# Patient Record
Sex: Female | Born: 1950 | Race: White | Hispanic: No | Marital: Married | Smoking: Never smoker
Health system: Southern US, Community
[De-identification: ages and names within clinical notes are randomized; demographics above are authoritative.]

## PROBLEM LIST (undated history)

## (undated) DIAGNOSIS — M7120 Synovial cyst of popliteal space [Baker], unspecified knee: Secondary | ICD-10-CM

## (undated) DIAGNOSIS — C801 Malignant (primary) neoplasm, unspecified: Secondary | ICD-10-CM

## (undated) DIAGNOSIS — I10 Essential (primary) hypertension: Secondary | ICD-10-CM

## (undated) DIAGNOSIS — R809 Proteinuria, unspecified: Secondary | ICD-10-CM

## (undated) DIAGNOSIS — H35039 Hypertensive retinopathy, unspecified eye: Secondary | ICD-10-CM

## (undated) DIAGNOSIS — Z9641 Presence of insulin pump (external) (internal): Secondary | ICD-10-CM

## (undated) DIAGNOSIS — J45909 Unspecified asthma, uncomplicated: Secondary | ICD-10-CM

## (undated) DIAGNOSIS — K219 Gastro-esophageal reflux disease without esophagitis: Secondary | ICD-10-CM

## (undated) DIAGNOSIS — Z8489 Family history of other specified conditions: Secondary | ICD-10-CM

## (undated) DIAGNOSIS — E785 Hyperlipidemia, unspecified: Secondary | ICD-10-CM

## (undated) DIAGNOSIS — M1990 Unspecified osteoarthritis, unspecified site: Secondary | ICD-10-CM

## (undated) DIAGNOSIS — Z972 Presence of dental prosthetic device (complete) (partial): Secondary | ICD-10-CM

## (undated) DIAGNOSIS — K760 Fatty (change of) liver, not elsewhere classified: Secondary | ICD-10-CM

## (undated) DIAGNOSIS — B029 Zoster without complications: Secondary | ICD-10-CM

## (undated) DIAGNOSIS — E119 Type 2 diabetes mellitus without complications: Secondary | ICD-10-CM

## (undated) DIAGNOSIS — H409 Unspecified glaucoma: Secondary | ICD-10-CM

## (undated) DIAGNOSIS — E11618 Type 2 diabetes mellitus with other diabetic arthropathy: Secondary | ICD-10-CM

## (undated) HISTORY — PX: ESOPHAGOGASTRODUODENOSCOPY: SHX1529

## (undated) HISTORY — PX: JOINT REPLACEMENT: SHX530

## (undated) HISTORY — PX: COLONOSCOPY: SHX174

## (undated) HISTORY — PX: REFRACTIVE SURGERY: SHX103

## (undated) HISTORY — PX: TOTAL KNEE ARTHROPLASTY: SHX125

## (undated) HISTORY — PX: EYE SURGERY: SHX253

---

## 1980-08-25 HISTORY — PX: BREAST BIOPSY: SHX20

## 2003-08-26 HISTORY — PX: POPLITEAL SYNOVIAL CYST EXCISION: SUR555

## 2008-03-13 ENCOUNTER — Ambulatory Visit: Payer: Self-pay | Admitting: Nurse Practitioner

## 2008-04-22 ENCOUNTER — Emergency Department: Payer: Self-pay | Admitting: Emergency Medicine

## 2011-02-19 ENCOUNTER — Ambulatory Visit: Payer: Self-pay | Admitting: Family Medicine

## 2011-04-11 ENCOUNTER — Ambulatory Visit: Payer: Self-pay | Admitting: Gastroenterology

## 2011-04-30 ENCOUNTER — Ambulatory Visit: Payer: Self-pay | Admitting: Ophthalmology

## 2011-06-24 ENCOUNTER — Ambulatory Visit: Payer: Self-pay | Admitting: Ophthalmology

## 2011-06-24 DIAGNOSIS — I119 Hypertensive heart disease without heart failure: Secondary | ICD-10-CM

## 2011-07-07 ENCOUNTER — Ambulatory Visit: Payer: Self-pay | Admitting: Ophthalmology

## 2011-07-13 ENCOUNTER — Emergency Department: Payer: Self-pay | Admitting: Internal Medicine

## 2012-01-26 ENCOUNTER — Ambulatory Visit: Payer: Self-pay | Admitting: Anesthesiology

## 2012-01-26 LAB — BASIC METABOLIC PANEL
Anion Gap: 7 (ref 7–16)
Co2: 27 mmol/L (ref 21–32)
Creatinine: 0.77 mg/dL (ref 0.60–1.30)
Glucose: 77 mg/dL (ref 65–99)
Potassium: 4.3 mmol/L (ref 3.5–5.1)

## 2012-01-29 ENCOUNTER — Ambulatory Visit: Payer: Self-pay | Admitting: Otolaryngology

## 2012-03-31 ENCOUNTER — Ambulatory Visit: Payer: Self-pay | Admitting: Nurse Practitioner

## 2012-04-01 ENCOUNTER — Ambulatory Visit: Payer: Self-pay | Admitting: Nurse Practitioner

## 2012-05-19 DIAGNOSIS — M1990 Unspecified osteoarthritis, unspecified site: Secondary | ICD-10-CM | POA: Insufficient documentation

## 2012-08-25 DIAGNOSIS — B029 Zoster without complications: Secondary | ICD-10-CM

## 2012-08-25 HISTORY — DX: Zoster without complications: B02.9

## 2012-09-01 ENCOUNTER — Ambulatory Visit: Payer: Self-pay | Admitting: Nurse Practitioner

## 2012-11-05 ENCOUNTER — Ambulatory Visit: Payer: Self-pay | Admitting: Gastroenterology

## 2013-04-06 ENCOUNTER — Ambulatory Visit: Payer: Self-pay | Admitting: Nurse Practitioner

## 2013-06-07 DIAGNOSIS — E1159 Type 2 diabetes mellitus with other circulatory complications: Secondary | ICD-10-CM | POA: Insufficient documentation

## 2013-06-07 DIAGNOSIS — K219 Gastro-esophageal reflux disease without esophagitis: Secondary | ICD-10-CM | POA: Insufficient documentation

## 2013-11-22 ENCOUNTER — Ambulatory Visit: Payer: Self-pay | Admitting: Gastroenterology

## 2013-12-12 ENCOUNTER — Ambulatory Visit: Payer: Self-pay | Admitting: Gastroenterology

## 2013-12-12 ENCOUNTER — Emergency Department: Payer: Self-pay | Admitting: Emergency Medicine

## 2013-12-16 LAB — PATHOLOGY REPORT

## 2014-04-12 ENCOUNTER — Ambulatory Visit: Payer: Self-pay | Admitting: Nurse Practitioner

## 2014-04-27 DIAGNOSIS — Z4681 Encounter for fitting and adjustment of insulin pump: Secondary | ICD-10-CM | POA: Insufficient documentation

## 2014-04-27 DIAGNOSIS — E11649 Type 2 diabetes mellitus with hypoglycemia without coma: Secondary | ICD-10-CM | POA: Insufficient documentation

## 2014-04-27 DIAGNOSIS — R809 Proteinuria, unspecified: Secondary | ICD-10-CM | POA: Insufficient documentation

## 2014-12-17 NOTE — Op Note (Signed)
PATIENT NAME:  Cindy Sandoval, Cindy Sandoval MR#:  [id number] DATE OF BIRTH:  01-13-1951  DATE OF PROCEDURE:  01/29/2012  PREOPERATIVE DIAGNOSIS:  1. Chronic pansinusitis.  2. Nasal obstruction secondary to septal deformity and bilateral inferior turbinate hypertrophy.   POSTOPERATIVE DIAGNOSES:  1. Chronic pansinusitis.  2. Nasal obstruction secondary to septal deformity and bilateral inferior turbinate hypertrophy.   PROCEDURES:  1. Image guided sinus surgery.  2. Bilateral frontal sinusotomy with tissue removal.  3. Bilateral anterior and posterior ethmoidectomy with tissue removal.  4. Bilateral sphenoidectomy with tissue removal.  5. Bilateral maxillary sinus antrostomy with tissue removal.  6. Septoplasty.  7. Bilateral submucous resection of the inferior turbinates.   SURGEON: Janalee Dane, M.D.   FINDINGS: The paranasal sinuses were extremely inflamed. There was no purulence encountered. However, the mucosa was polypoid and inflamed throughout the ethmoid and frontal recesses. There were residual polyps lining the entirety of the left frontal antrum. The right frontal antrum was without polyps or purulence. The septum was deviated primarily to the left, at the maxillary crest, and back to the right more superiorly. The inferior turbinates were severely hypertrophied   DESCRIPTION OF PROCEDURE: The image-guided sinus surgery system mask was attached and registration was carried out in the standard fashion using the appropriate fiduciary points. Calibration of the system was confirmed and extensive review of the CT scan in all three dimensions preoperatively and intraoperatively was carried out.  Each instrument was registered and confirmed for anatomic accuracy.  The patient was turned 90 degrees counter clockwise from anesthesia.  The nose was anesthetized with infraorbital nerve blocks and septal injection with 0.5% Lidocaine and 0.25% Bupivacaine mixed with 1:150,000 with Epinephrine and  phenylephrine Lidocaine soaked pledgets, two on each side were placed and the face was prepped and draped in the usual fashion.  The pledgets were removed.  A 15 blade was used to make a left-sided hemitransfixion incision and septal mucoperichondrial mucoperiosteal leaflets elevated.  There was a large inferior spur that was resected with Jansen-Middleton forceps.  The remaining septum was deviated back and forth in an accordion like fashion.  The bony cartilaginous junction was then divided and a moderate amount of vomer and perpendicular plate was taken down with Jansen-Middleton forceps, releasing the tension on the remaining septum.  The septum then swung back into the midline.  The septal leaflets were closed with quilting 4-0 chromic suture.  The left sided hemitransfixion incision was closed with 4-0 plain gut.  Attention was directed to the turbinates which had been previously injected on the left.  The head of the inferior turbinate on the left was incised with a 15 blade and the medial mucoperiosteum was elevated using a Psychologist, educational.  Once this had been elevated Knight scissors were used to resect the conchal bone and lateral mucoperiosteum.  The inferior margin of the remaining mucoperiosteum was then cauterized with suction cautery and Surgiflo was placed at the inferior to the inferior margin of the remaining inferior turbinate.  An identical procedure was performed on the right inferior turbinate with once again placement of Surgiflo along its inferior margin.   Attention was directed to the sinus surgery. The left side was addressed first. The middle turbinate was medialized with a freer. The uncinate process was then identified and completely resected revealing the natural maxillary sinus ostium. The natural maxillary sinus ostium was progressively enlarged to create a large maxillary antrostomy, removing diseased tissue. The maxillary antrum was then irrigated copiously with saline. Attention  was directed to the ethmoid sinuses where the ethmoid sinuses were taken down from anterior to posterior preserving the skull base and the lamina papyracea, removing diseased tissue. After the ethmoid sinuses had been completely cleaned out of polypoid chronically inflamed mucosa preserving the mucosa on the medial side of the middle turbinate along the skull base and along the lamina papyracea, attention was directed to the sphenoid recess. The sphenoid sinus ostium was identified and progressively enlarged with Kerrison Rongeur, staying inferior and medial in orientation, to create a large sphenoid sinus antrostomy, removing diseased tissue. The 30 degree scope was then used to explore the frontal recess. The frontal recess was progressively enlarged meticulously, removing diseased tissue, and the frontal sinus was then copiously irrigated with saline as was the sphenoid sinus. Attention was then directed to the right side where an identical series of procedures was accomplished. Upon completion of the sinus surgery, the Surgiflo was placed. Temporary Telfa pledgets were placed and tied over the columella to prevent dislodging.    The patient was allowed to emerge from anesthesia, extubated in the operating room and taken to the recovery room in stable condition.  There were no complications.  Estimated blood loss was less than 10 milliliters. ____________________________ Lenna Sciara. Nadeen Landau, MD jmc:slb D: 01/29/2012 18:21:27 ET T: 01/30/2012 10:00:45 ET JOB#: [id number]  cc: Janalee Dane, MD, <Dictator> Nicholos Johns MD ELECTRONICALLY SIGNED 02/18/2012 12:18

## 2015-04-18 ENCOUNTER — Other Ambulatory Visit: Payer: Self-pay | Admitting: Nurse Practitioner

## 2015-04-18 DIAGNOSIS — Z1231 Encounter for screening mammogram for malignant neoplasm of breast: Secondary | ICD-10-CM

## 2015-04-19 ENCOUNTER — Ambulatory Visit
Admission: RE | Admit: 2015-04-19 | Discharge: 2015-04-19 | Disposition: A | Discharge status: Home / Self Care | Payer: PPO | Source: Ambulatory Visit | Attending: Nurse Practitioner | Admitting: Nurse Practitioner

## 2015-04-19 DIAGNOSIS — Z1231 Encounter for screening mammogram for malignant neoplasm of breast: Secondary | ICD-10-CM | POA: Insufficient documentation

## 2015-04-19 HISTORY — DX: Malignant (primary) neoplasm, unspecified: C80.1

## 2015-09-27 DIAGNOSIS — H02403 Unspecified ptosis of bilateral eyelids: Secondary | ICD-10-CM | POA: Diagnosis not present

## 2015-10-16 DIAGNOSIS — H40052 Ocular hypertension, left eye: Secondary | ICD-10-CM | POA: Diagnosis not present

## 2015-11-01 DIAGNOSIS — R809 Proteinuria, unspecified: Secondary | ICD-10-CM | POA: Diagnosis not present

## 2015-11-01 DIAGNOSIS — E1029 Type 1 diabetes mellitus with other diabetic kidney complication: Secondary | ICD-10-CM | POA: Diagnosis not present

## 2015-11-08 DIAGNOSIS — I10 Essential (primary) hypertension: Secondary | ICD-10-CM | POA: Diagnosis not present

## 2015-11-08 DIAGNOSIS — R809 Proteinuria, unspecified: Secondary | ICD-10-CM | POA: Diagnosis not present

## 2015-11-08 DIAGNOSIS — E785 Hyperlipidemia, unspecified: Secondary | ICD-10-CM | POA: Diagnosis not present

## 2015-11-08 DIAGNOSIS — E10649 Type 1 diabetes mellitus with hypoglycemia without coma: Secondary | ICD-10-CM | POA: Diagnosis not present

## 2015-11-08 DIAGNOSIS — E1042 Type 1 diabetes mellitus with diabetic polyneuropathy: Secondary | ICD-10-CM | POA: Diagnosis not present

## 2015-11-08 DIAGNOSIS — Z4681 Encounter for fitting and adjustment of insulin pump: Secondary | ICD-10-CM | POA: Diagnosis not present

## 2015-11-08 DIAGNOSIS — E1029 Type 1 diabetes mellitus with other diabetic kidney complication: Secondary | ICD-10-CM | POA: Diagnosis not present

## 2015-12-31 DIAGNOSIS — Z85828 Personal history of other malignant neoplasm of skin: Secondary | ICD-10-CM | POA: Diagnosis not present

## 2015-12-31 DIAGNOSIS — D485 Neoplasm of uncertain behavior of skin: Secondary | ICD-10-CM | POA: Diagnosis not present

## 2015-12-31 DIAGNOSIS — L570 Actinic keratosis: Secondary | ICD-10-CM | POA: Diagnosis not present

## 2015-12-31 DIAGNOSIS — Z08 Encounter for follow-up examination after completed treatment for malignant neoplasm: Secondary | ICD-10-CM | POA: Diagnosis not present

## 2015-12-31 DIAGNOSIS — Z1283 Encounter for screening for malignant neoplasm of skin: Secondary | ICD-10-CM | POA: Diagnosis not present

## 2015-12-31 DIAGNOSIS — L249 Irritant contact dermatitis, unspecified cause: Secondary | ICD-10-CM | POA: Diagnosis not present

## 2015-12-31 DIAGNOSIS — Z872 Personal history of diseases of the skin and subcutaneous tissue: Secondary | ICD-10-CM | POA: Diagnosis not present

## 2016-01-03 DIAGNOSIS — E1029 Type 1 diabetes mellitus with other diabetic kidney complication: Secondary | ICD-10-CM | POA: Diagnosis not present

## 2016-01-29 DIAGNOSIS — D225 Melanocytic nevi of trunk: Secondary | ICD-10-CM | POA: Diagnosis not present

## 2016-01-29 DIAGNOSIS — D485 Neoplasm of uncertain behavior of skin: Secondary | ICD-10-CM | POA: Diagnosis not present

## 2016-02-06 DIAGNOSIS — H9220 Otorrhagia, unspecified ear: Secondary | ICD-10-CM | POA: Diagnosis not present

## 2016-02-06 DIAGNOSIS — H6060 Unspecified chronic otitis externa, unspecified ear: Secondary | ICD-10-CM | POA: Diagnosis not present

## 2016-02-18 DIAGNOSIS — E1029 Type 1 diabetes mellitus with other diabetic kidney complication: Secondary | ICD-10-CM | POA: Diagnosis not present

## 2016-02-18 DIAGNOSIS — R809 Proteinuria, unspecified: Secondary | ICD-10-CM | POA: Diagnosis not present

## 2016-02-21 DIAGNOSIS — Z4681 Encounter for fitting and adjustment of insulin pump: Secondary | ICD-10-CM | POA: Diagnosis not present

## 2016-02-21 DIAGNOSIS — R809 Proteinuria, unspecified: Secondary | ICD-10-CM | POA: Diagnosis not present

## 2016-02-21 DIAGNOSIS — E10649 Type 1 diabetes mellitus with hypoglycemia without coma: Secondary | ICD-10-CM | POA: Diagnosis not present

## 2016-02-21 DIAGNOSIS — E1029 Type 1 diabetes mellitus with other diabetic kidney complication: Secondary | ICD-10-CM | POA: Diagnosis not present

## 2016-02-21 DIAGNOSIS — E785 Hyperlipidemia, unspecified: Secondary | ICD-10-CM | POA: Diagnosis not present

## 2016-02-21 DIAGNOSIS — E1042 Type 1 diabetes mellitus with diabetic polyneuropathy: Secondary | ICD-10-CM | POA: Diagnosis not present

## 2016-04-04 DIAGNOSIS — E1029 Type 1 diabetes mellitus with other diabetic kidney complication: Secondary | ICD-10-CM | POA: Diagnosis not present

## 2016-04-15 DIAGNOSIS — H40052 Ocular hypertension, left eye: Secondary | ICD-10-CM | POA: Diagnosis not present

## 2016-04-18 ENCOUNTER — Other Ambulatory Visit: Payer: Self-pay | Admitting: Nurse Practitioner

## 2016-04-18 DIAGNOSIS — Z0000 Encounter for general adult medical examination without abnormal findings: Secondary | ICD-10-CM | POA: Diagnosis not present

## 2016-04-18 DIAGNOSIS — E109 Type 1 diabetes mellitus without complications: Secondary | ICD-10-CM | POA: Diagnosis not present

## 2016-04-18 DIAGNOSIS — Z1231 Encounter for screening mammogram for malignant neoplasm of breast: Secondary | ICD-10-CM

## 2016-04-18 DIAGNOSIS — Z1159 Encounter for screening for other viral diseases: Secondary | ICD-10-CM | POA: Diagnosis not present

## 2016-04-18 DIAGNOSIS — E7800 Pure hypercholesterolemia, unspecified: Secondary | ICD-10-CM | POA: Diagnosis not present

## 2016-04-18 DIAGNOSIS — Z79899 Other long term (current) drug therapy: Secondary | ICD-10-CM | POA: Diagnosis not present

## 2016-04-18 DIAGNOSIS — I10 Essential (primary) hypertension: Secondary | ICD-10-CM | POA: Diagnosis not present

## 2016-04-25 DIAGNOSIS — H40052 Ocular hypertension, left eye: Secondary | ICD-10-CM | POA: Diagnosis not present

## 2016-05-06 ENCOUNTER — Ambulatory Visit: Admission: RE | Admit: 2016-05-06 | Payer: PPO | Source: Ambulatory Visit

## 2016-05-12 ENCOUNTER — Ambulatory Visit
Admission: RE | Admit: 2016-05-12 | Discharge: 2016-05-12 | Disposition: A | Discharge status: Home / Self Care | Payer: PPO | Source: Ambulatory Visit | Attending: Nurse Practitioner | Admitting: Nurse Practitioner

## 2016-05-12 DIAGNOSIS — Z1231 Encounter for screening mammogram for malignant neoplasm of breast: Secondary | ICD-10-CM

## 2016-05-15 DIAGNOSIS — E7800 Pure hypercholesterolemia, unspecified: Secondary | ICD-10-CM | POA: Diagnosis not present

## 2016-05-15 DIAGNOSIS — Z1159 Encounter for screening for other viral diseases: Secondary | ICD-10-CM | POA: Diagnosis not present

## 2016-05-15 DIAGNOSIS — E1029 Type 1 diabetes mellitus with other diabetic kidney complication: Secondary | ICD-10-CM | POA: Diagnosis not present

## 2016-05-15 DIAGNOSIS — R809 Proteinuria, unspecified: Secondary | ICD-10-CM | POA: Diagnosis not present

## 2016-05-15 DIAGNOSIS — Z79899 Other long term (current) drug therapy: Secondary | ICD-10-CM | POA: Diagnosis not present

## 2016-05-22 DIAGNOSIS — E1042 Type 1 diabetes mellitus with diabetic polyneuropathy: Secondary | ICD-10-CM | POA: Diagnosis not present

## 2016-05-22 DIAGNOSIS — R809 Proteinuria, unspecified: Secondary | ICD-10-CM | POA: Diagnosis not present

## 2016-05-22 DIAGNOSIS — Z23 Encounter for immunization: Secondary | ICD-10-CM | POA: Diagnosis not present

## 2016-05-22 DIAGNOSIS — E1029 Type 1 diabetes mellitus with other diabetic kidney complication: Secondary | ICD-10-CM | POA: Diagnosis not present

## 2016-05-22 DIAGNOSIS — Z4681 Encounter for fitting and adjustment of insulin pump: Secondary | ICD-10-CM | POA: Diagnosis not present

## 2016-05-22 DIAGNOSIS — R635 Abnormal weight gain: Secondary | ICD-10-CM | POA: Diagnosis not present

## 2016-05-22 DIAGNOSIS — E10649 Type 1 diabetes mellitus with hypoglycemia without coma: Secondary | ICD-10-CM | POA: Diagnosis not present

## 2016-05-22 DIAGNOSIS — E785 Hyperlipidemia, unspecified: Secondary | ICD-10-CM | POA: Diagnosis not present

## 2016-05-28 DIAGNOSIS — Z23 Encounter for immunization: Secondary | ICD-10-CM | POA: Diagnosis not present

## 2016-05-28 DIAGNOSIS — M25561 Pain in right knee: Secondary | ICD-10-CM | POA: Diagnosis not present

## 2016-05-28 DIAGNOSIS — M153 Secondary multiple arthritis: Secondary | ICD-10-CM | POA: Diagnosis not present

## 2016-05-28 DIAGNOSIS — G8929 Other chronic pain: Secondary | ICD-10-CM | POA: Diagnosis not present

## 2016-07-02 DIAGNOSIS — Z800 Family history of malignant neoplasm of digestive organs: Secondary | ICD-10-CM | POA: Diagnosis not present

## 2016-07-04 DIAGNOSIS — E1029 Type 1 diabetes mellitus with other diabetic kidney complication: Secondary | ICD-10-CM | POA: Diagnosis not present

## 2016-08-13 DIAGNOSIS — Z08 Encounter for follow-up examination after completed treatment for malignant neoplasm: Secondary | ICD-10-CM | POA: Diagnosis not present

## 2016-08-13 DIAGNOSIS — L298 Other pruritus: Secondary | ICD-10-CM | POA: Diagnosis not present

## 2016-08-13 DIAGNOSIS — L570 Actinic keratosis: Secondary | ICD-10-CM | POA: Diagnosis not present

## 2016-08-13 DIAGNOSIS — Z1283 Encounter for screening for malignant neoplasm of skin: Secondary | ICD-10-CM | POA: Diagnosis not present

## 2016-08-13 DIAGNOSIS — Z872 Personal history of diseases of the skin and subcutaneous tissue: Secondary | ICD-10-CM | POA: Diagnosis not present

## 2016-08-13 DIAGNOSIS — Z85828 Personal history of other malignant neoplasm of skin: Secondary | ICD-10-CM | POA: Diagnosis not present

## 2016-08-21 DIAGNOSIS — R635 Abnormal weight gain: Secondary | ICD-10-CM | POA: Diagnosis not present

## 2016-08-21 DIAGNOSIS — E1029 Type 1 diabetes mellitus with other diabetic kidney complication: Secondary | ICD-10-CM | POA: Diagnosis not present

## 2016-08-21 DIAGNOSIS — R809 Proteinuria, unspecified: Secondary | ICD-10-CM | POA: Diagnosis not present

## 2016-08-28 DIAGNOSIS — I10 Essential (primary) hypertension: Secondary | ICD-10-CM | POA: Diagnosis not present

## 2016-08-28 DIAGNOSIS — E10649 Type 1 diabetes mellitus with hypoglycemia without coma: Secondary | ICD-10-CM | POA: Diagnosis not present

## 2016-08-28 DIAGNOSIS — E1042 Type 1 diabetes mellitus with diabetic polyneuropathy: Secondary | ICD-10-CM | POA: Diagnosis not present

## 2016-08-28 DIAGNOSIS — R809 Proteinuria, unspecified: Secondary | ICD-10-CM | POA: Diagnosis not present

## 2016-08-28 DIAGNOSIS — E785 Hyperlipidemia, unspecified: Secondary | ICD-10-CM | POA: Diagnosis not present

## 2016-08-28 DIAGNOSIS — E1029 Type 1 diabetes mellitus with other diabetic kidney complication: Secondary | ICD-10-CM | POA: Diagnosis not present

## 2016-08-28 DIAGNOSIS — Z4681 Encounter for fitting and adjustment of insulin pump: Secondary | ICD-10-CM | POA: Diagnosis not present

## 2016-10-03 DIAGNOSIS — E1029 Type 1 diabetes mellitus with other diabetic kidney complication: Secondary | ICD-10-CM | POA: Diagnosis not present

## 2016-10-17 ENCOUNTER — Encounter: Payer: Self-pay | Admitting: *Deleted

## 2016-10-20 ENCOUNTER — Encounter
Admission: RE | Disposition: A | Discharge status: Home / Self Care | Payer: Self-pay | Source: Ambulatory Visit | Attending: Gastroenterology

## 2016-10-20 ENCOUNTER — Ambulatory Visit
Admission: RE | Admit: 2016-10-20 | Discharge: 2016-10-20 | Disposition: A | Discharge status: Home / Self Care | Payer: PPO | Source: Ambulatory Visit | Attending: Gastroenterology | Admitting: Gastroenterology

## 2016-10-20 ENCOUNTER — Ambulatory Visit: Payer: PPO | Admitting: Anesthesiology

## 2016-10-20 ENCOUNTER — Encounter: Payer: Self-pay | Admitting: Anesthesiology

## 2016-10-20 DIAGNOSIS — E119 Type 2 diabetes mellitus without complications: Secondary | ICD-10-CM | POA: Diagnosis not present

## 2016-10-20 DIAGNOSIS — Z85828 Personal history of other malignant neoplasm of skin: Secondary | ICD-10-CM | POA: Diagnosis not present

## 2016-10-20 DIAGNOSIS — Z800 Family history of malignant neoplasm of digestive organs: Secondary | ICD-10-CM | POA: Diagnosis not present

## 2016-10-20 DIAGNOSIS — Z8371 Family history of colonic polyps: Secondary | ICD-10-CM | POA: Diagnosis not present

## 2016-10-20 DIAGNOSIS — Z1211 Encounter for screening for malignant neoplasm of colon: Secondary | ICD-10-CM | POA: Diagnosis not present

## 2016-10-20 DIAGNOSIS — Z7982 Long term (current) use of aspirin: Secondary | ICD-10-CM | POA: Insufficient documentation

## 2016-10-20 DIAGNOSIS — K219 Gastro-esophageal reflux disease without esophagitis: Secondary | ICD-10-CM | POA: Diagnosis not present

## 2016-10-20 DIAGNOSIS — I10 Essential (primary) hypertension: Secondary | ICD-10-CM | POA: Diagnosis not present

## 2016-10-20 DIAGNOSIS — Z794 Long term (current) use of insulin: Secondary | ICD-10-CM | POA: Insufficient documentation

## 2016-10-20 DIAGNOSIS — M1990 Unspecified osteoarthritis, unspecified site: Secondary | ICD-10-CM | POA: Diagnosis not present

## 2016-10-20 DIAGNOSIS — Q438 Other specified congenital malformations of intestine: Secondary | ICD-10-CM | POA: Insufficient documentation

## 2016-10-20 DIAGNOSIS — Z79899 Other long term (current) drug therapy: Secondary | ICD-10-CM | POA: Insufficient documentation

## 2016-10-20 DIAGNOSIS — E785 Hyperlipidemia, unspecified: Secondary | ICD-10-CM | POA: Insufficient documentation

## 2016-10-20 HISTORY — DX: Type 2 diabetes mellitus without complications: E11.9

## 2016-10-20 HISTORY — DX: Essential (primary) hypertension: I10

## 2016-10-20 HISTORY — DX: Hyperlipidemia, unspecified: E78.5

## 2016-10-20 HISTORY — PX: COLONOSCOPY WITH PROPOFOL: SHX5780

## 2016-10-20 HISTORY — DX: Gastro-esophageal reflux disease without esophagitis: K21.9

## 2016-10-20 HISTORY — DX: Synovial cyst of popliteal space (Baker), unspecified knee: M71.20

## 2016-10-20 HISTORY — DX: Unspecified osteoarthritis, unspecified site: M19.90

## 2016-10-20 HISTORY — DX: Type 2 diabetes mellitus with other diabetic arthropathy: E11.618

## 2016-10-20 HISTORY — DX: Proteinuria, unspecified: R80.9

## 2016-10-20 LAB — GLUCOSE, CAPILLARY: GLUCOSE-CAPILLARY: 126 mg/dL — AB (ref 65–99)

## 2016-10-20 SURGERY — COLONOSCOPY WITH PROPOFOL
Anesthesia: General

## 2016-10-20 MED ORDER — PROPOFOL 500 MG/50ML IV EMUL
INTRAVENOUS | Status: DC | PRN
  Administered 2016-10-20: 140 ug/kg/min via INTRAVENOUS

## 2016-10-20 MED ORDER — MIDAZOLAM HCL 2 MG/2ML IJ SOLN
INTRAMUSCULAR | Status: DC | PRN
  Administered 2016-10-20: 2 mg via INTRAVENOUS

## 2016-10-20 MED ORDER — EPHEDRINE SULFATE 50 MG/ML IJ SOLN
INTRAMUSCULAR | Status: DC | PRN
  Administered 2016-10-20 (×2): 10 mg via INTRAVENOUS

## 2016-10-20 MED ORDER — SODIUM CHLORIDE 0.9 % IV SOLN
INTRAVENOUS | Status: DC
  Administered 2016-10-20: 1000 mL via INTRAVENOUS
  Administered 2016-10-20: 08:00:00 via INTRAVENOUS

## 2016-10-20 MED ORDER — SODIUM CHLORIDE 0.9 % IV SOLN: INTRAVENOUS | Status: DC

## 2016-10-20 MED ORDER — PROPOFOL 10 MG/ML IV BOLUS
INTRAVENOUS | Status: DC | PRN
  Administered 2016-10-20: 70 mg via INTRAVENOUS
  Administered 2016-10-20: 30 mg via INTRAVENOUS

## 2016-10-20 MED ORDER — FENTANYL CITRATE (PF) 100 MCG/2ML IJ SOLN
INTRAMUSCULAR | Status: AC
  Filled 2016-10-20: qty 2

## 2016-10-20 MED ORDER — PROPOFOL 10 MG/ML IV BOLUS
INTRAVENOUS | Status: AC
  Filled 2016-10-20: qty 20

## 2016-10-20 MED ORDER — LIDOCAINE HCL (PF) 2 % IJ SOLN
INTRAMUSCULAR | Status: AC
  Filled 2016-10-20: qty 2

## 2016-10-20 MED ORDER — PROPOFOL 500 MG/50ML IV EMUL
INTRAVENOUS | Status: AC
  Filled 2016-10-20: qty 50

## 2016-10-20 MED ORDER — FENTANYL CITRATE (PF) 100 MCG/2ML IJ SOLN
INTRAMUSCULAR | Status: DC | PRN
  Administered 2016-10-20: 50 ug via INTRAVENOUS
  Administered 2016-10-20: 25 ug via INTRAVENOUS

## 2016-10-20 MED ORDER — MIDAZOLAM HCL 2 MG/2ML IJ SOLN
INTRAMUSCULAR | Status: AC
  Filled 2016-10-20: qty 2

## 2016-10-20 NOTE — H&P (Signed)
Outpatient short stay form Pre-procedure 10/20/2016 7:22 AM Cindy Sails MD  Primary Physician: Gaetano Net NP  Reason for visit:  Colonoscopy  History of present illness:  Cindy Sandoval is a 66 year old female presenting today as above. There is family history of colon cancer in a primary relative as well as multiple primary relatives with colon polyps. She tolerated her prep well. She does take 81 mg aspirin but that has been held. She takes no other aspirin or blood thinning agents.    Current Facility-Administered Medications:  .  0.9 %  sodium chloride infusion, , Intravenous, Continuous, Manya Silvas, MD, Last Rate: 20 mL/hr at 10/20/16 0701, 1,000 mL at 10/20/16 0701 .  0.9 %  sodium chloride infusion, , Intravenous, Continuous, Cindy Sails, MD  Prescriptions Prior to Admission  Medication Sig Dispense Refill Last Dose  . amLODipine (NORVASC) 10 MG tablet Take 10 mg by mouth daily.   10/19/2016 at Unknown time  . aspirin EC 81 MG tablet Take 81 mg by mouth daily.   Past Week at Unknown time  . diclofenac sodium (VOLTAREN) 1 % GEL Apply topically 4 (four) times daily.   Past Week at Unknown time  . DULoxetine (CYMBALTA) 30 MG capsule Take 30 mg by mouth daily.   Past Week at Unknown time  . insulin aspart (NOVOLOG) 100 UNIT/ML injection Inject 100 Units into the skin 3 (three) times daily before meals.   Past Week at Unknown time  . insulin detemir (LEVEMIR) 100 UNIT/ML injection Inject 100 Units into the skin at bedtime.   Past Week at Unknown time  . Insulin Human (INSULIN PUMP) SOLN Inject into the skin.   Past Week at Unknown time  . losartan (COZAAR) 100 MG tablet Take 100 mg by mouth daily.   10/19/2016 at Unknown time  . omeprazole (PRILOSEC) 40 MG capsule Take 40 mg by mouth daily.   Past Week at Unknown time  . ondansetron (ZOFRAN-ODT) 4 MG disintegrating tablet Take 4 mg by mouth every 8 (eight) hours as needed for nausea or vomiting.   Past Week at Unknown time  .  pravastatin (PRAVACHOL) 20 MG tablet Take 20 mg by mouth daily.   10/19/2016 at Unknown time     Allergies  Allergen Reactions  . Kiwi Extract Anaphylaxis  . Bactroban [Mupirocin Calcium] Swelling  . Cephalexin Diarrhea  . Dexilant [Dexlansoprazole] Other (See Comments)  . Insulin Glargine Hives  . Lisinopril Other (See Comments)    UNKNOWN REACTION  . Septra [Sulfamethoxazole-Trimethoprim] Nausea And Vomiting  . Simvastatin Other (See Comments)  . Adhesive [Tape] Rash     Past Medical History:  Diagnosis Date  . Arthritis    OSTEOARTHRITIS  . Baker's cyst of knee   . Cancer ([place])    basal cell  . Diabetes mellitus without complication ([place])   . Diabetic arthropathy ([place])   . GERD (gastroesophageal reflux disease)   . Hyperlipidemia   . Hypertension   . Microalbuminuria     Review of systems:      Physical Exam    Heart and lungs: Regular rate and rhythm without rub or gallop, lungs are bilaterally clear.    HEENT: Normocephalic atraumatic eyes are anicteric    Other:     Pertinant exam for procedure: Soft nontender nondistended bowel sounds positive normoactive    Planned proceedures: Colonoscopy and indicated procedures. I have discussed the risks benefits and complications of procedures to include not limited to bleeding, infection, perforation and the  risk of sedation and the Cindy Sandoval wishes to proceed.    Cindy Sails, MD Gastroenterology 10/20/2016  7:22 AM

## 2016-10-20 NOTE — Anesthesia Post-op Follow-up Note (Cosign Needed)
Anesthesia QCDR form completed.        

## 2016-10-20 NOTE — Anesthesia Preprocedure Evaluation (Addendum)
Anesthesia Evaluation  Patient identified by MRN, date of birth, ID band Patient awake    Reviewed: Allergy & Precautions, H&P , NPO status , Patient's Chart, lab work & pertinent test results  History of Anesthesia Complications Negative for: history of anesthetic complications  Airway Mallampati: III  TM Distance: >3 FB Neck ROM: limited    Dental  (+) Poor Dentition, Chipped, Implants, Lower Dentures   Pulmonary neg pulmonary ROS, neg shortness of breath,    Pulmonary exam normal breath sounds clear to auscultation       Cardiovascular Exercise Tolerance: Good hypertension, (-) angina(-) Past MI and (-) DOE Normal cardiovascular exam Rhythm:regular Rate:Normal     Neuro/Psych negative neurological ROS  negative psych ROS   GI/Hepatic Neg liver ROS, GERD  Controlled,  Endo/Other  diabetes, Type 2, Insulin Dependent  Renal/GU negative Renal ROS  negative genitourinary   Musculoskeletal  (+) Arthritis ,   Abdominal   Peds  Hematology negative hematology ROS (+)   Anesthesia Other Findings Past Medical History: No date: Arthritis     Comment: OSTEOARTHRITIS No date: Baker's cyst of knee No date: Cancer ([place])     Comment: basal cell No date: Diabetes mellitus without complication (HCC) No date: Diabetic arthropathy (HCC) No date: GERD (gastroesophageal reflux disease) No date: Hyperlipidemia No date: Hypertension No date: Microalbuminuria  Past Surgical History: No date: BREAST BIOPSY Left     Comment: neg No date: COLONOSCOPY No date: ESOPHAGOGASTRODUODENOSCOPY  BMI    Body Mass Index:  31.83 kg/m      Reproductive/Obstetrics negative OB ROS                            Anesthesia Physical Anesthesia Plan  ASA: III  Anesthesia Plan: General   Post-op Pain Management:    Induction:   Airway Management Planned:   Additional Equipment:   Intra-op Plan:    Post-operative Plan:   Informed Consent: I have reviewed the patients History and Physical, chart, labs and discussed the procedure including the risks, benefits and alternatives for the proposed anesthesia with the patient or authorized representative who has indicated his/her understanding and acceptance.   Dental Advisory Given  Plan Discussed with: Anesthesiologist, CRNA and Surgeon  Anesthesia Plan Comments:         Anesthesia Quick Evaluation

## 2016-10-20 NOTE — Transfer of Care (Signed)
Immediate Anesthesia Transfer of Care Note  Patient: Cindy Sandoval  Procedure(s) Performed: Procedure(s): COLONOSCOPY WITH PROPOFOL (N/A)  Patient Location: PACU  Anesthesia Type:General  Level of Consciousness: awake  Airway & Oxygen Therapy: Patient Spontanous Breathing  Post-op Assessment: Report given to RN and Post -op Vital signs reviewed and stable  Post vital signs: Reviewed and stable  Last Vitals:  Vitals:   10/20/16 0647 10/20/16 0833  BP: 126/65 (!) 112/46  Pulse: 76 95  Resp: 16 12  Temp: (!) 36.1 C (!) 36.1 C    Last Pain:  Vitals:   10/20/16 0833  TempSrc: Tympanic         Complications: No apparent anesthesia complications

## 2016-10-20 NOTE — Op Note (Signed)
[facility] Gastroenterology Patient Name: Cindy Sandoval Procedure Date: 10/20/2016 7:46 AM MRN: [id number] Account #: [id number] Date of Birth: [birth year] Admit Type: Outpatient Age: 66 Room: [facility] ENDO ROOM 3 Gender: Female Note Status: Finalized Procedure:            Colonoscopy Indications:          Family history of colon cancer in a first-degree                        relative, Family history of colonic polyps in a                        first-degree relative Providers:            Lollie Sails, MD Medicines:            Monitored Anesthesia Care Complications:        No immediate complications. Procedure:            Pre-Anesthesia Assessment:                       - ASA Grade Assessment: III - A patient with severe                        systemic disease.                       After obtaining informed consent, the colonoscope was                        passed under direct vision. Throughout the procedure,                        the patient's blood pressure, pulse, and oxygen                        saturations were monitored continuously. The                        Colonoscope was introduced through the anus and                        advanced to the the cecum, identified by appendiceal                        orifice and ileocecal valve. The patient tolerated the                        procedure well. The quality of the bowel preparation                        was fair. Findings:      The sigmoid colon was moderately redundant.      The colon (entire examined portion) appeared normal.      The digital rectal exam was normal. Impression:           - Preparation of the colon was fair.                       - Redundant colon.                       -  The entire examined colon is normal.                       - No specimens collected. Recommendation:       - Discharge patient to home.                       - Repeat colonoscopy in 5 years for screening  purposes. Procedure Code(s):    --- Professional ---                       [phone], Colonoscopy, flexible; diagnostic, including                        collection of specimen(s) by brushing or washing, when                        performed (separate procedure) Diagnosis Code(s):    --- Professional ---                       Z80.0, Family history of malignant neoplasm of                        digestive organs                       Z83.71, Family history of colonic polyps                       Q43.8, Other specified congenital malformations of                        intestine CPT copyright 2016 American Medical Association. All rights reserved. The codes documented in this report are preliminary and upon coder review may  be revised to meet current compliance requirements. Lollie Sails, MD 10/20/2016 8:30:22 AM This report has been signed electronically. Number of Addenda: 0 Note Initiated On: 10/20/2016 7:46 AM Scope Withdrawal Time: 0 hours 12 minutes 56 seconds  Total Procedure Duration: 0 hours 33 minutes 10 seconds       [facility]

## 2016-10-20 NOTE — Anesthesia Postprocedure Evaluation (Signed)
Anesthesia Post Note  Patient: Cindy Sandoval  Procedure(s) Performed: Procedure(s) (LRB): COLONOSCOPY WITH PROPOFOL (N/A)  Patient location during evaluation: Endoscopy Anesthesia Type: General Level of consciousness: awake and alert Pain management: pain level controlled Vital Signs Assessment: post-procedure vital signs reviewed and stable Respiratory status: spontaneous breathing, nonlabored ventilation, respiratory function stable and patient connected to nasal cannula oxygen Cardiovascular status: blood pressure returned to baseline and stable Postop Assessment: no signs of nausea or vomiting Anesthetic complications: no     Last Vitals:  Vitals:   10/20/16 0843 10/20/16 0853  BP: (!) 124/58 (!) 135/59  Pulse: 96 (!) 133  Resp: (!) 22 15  Temp:      Last Pain:  Vitals:   10/20/16 0833  TempSrc: Tympanic                 Precious Haws Janus Vlcek

## 2016-10-20 NOTE — Anesthesia Procedure Notes (Signed)
Date/Time: 10/20/2016 7:46 AM Performed by: Allean Found Pre-anesthesia Checklist: Patient identified, Emergency Drugs available, Suction available, Patient being monitored and Timeout performed Patient Re-evaluated:Patient Re-evaluated prior to inductionOxygen Delivery Method: Nasal cannula Intubation Type: IV induction Placement Confirmation: positive ETCO2

## 2016-10-21 ENCOUNTER — Encounter: Payer: Self-pay | Admitting: Gastroenterology

## 2016-11-03 DIAGNOSIS — E119 Type 2 diabetes mellitus without complications: Secondary | ICD-10-CM | POA: Diagnosis not present

## 2016-12-05 DIAGNOSIS — E1029 Type 1 diabetes mellitus with other diabetic kidney complication: Secondary | ICD-10-CM | POA: Diagnosis not present

## 2016-12-05 DIAGNOSIS — R809 Proteinuria, unspecified: Secondary | ICD-10-CM | POA: Diagnosis not present

## 2016-12-11 DIAGNOSIS — E1065 Type 1 diabetes mellitus with hyperglycemia: Secondary | ICD-10-CM | POA: Diagnosis not present

## 2016-12-11 DIAGNOSIS — R809 Proteinuria, unspecified: Secondary | ICD-10-CM | POA: Diagnosis not present

## 2016-12-11 DIAGNOSIS — E1042 Type 1 diabetes mellitus with diabetic polyneuropathy: Secondary | ICD-10-CM | POA: Diagnosis not present

## 2016-12-11 DIAGNOSIS — I10 Essential (primary) hypertension: Secondary | ICD-10-CM | POA: Diagnosis not present

## 2016-12-11 DIAGNOSIS — E1029 Type 1 diabetes mellitus with other diabetic kidney complication: Secondary | ICD-10-CM | POA: Diagnosis not present

## 2016-12-11 DIAGNOSIS — E785 Hyperlipidemia, unspecified: Secondary | ICD-10-CM | POA: Diagnosis not present

## 2016-12-11 DIAGNOSIS — Z4681 Encounter for fitting and adjustment of insulin pump: Secondary | ICD-10-CM | POA: Diagnosis not present

## 2016-12-31 DIAGNOSIS — E1029 Type 1 diabetes mellitus with other diabetic kidney complication: Secondary | ICD-10-CM | POA: Diagnosis not present

## 2017-02-10 DIAGNOSIS — M25511 Pain in right shoulder: Secondary | ICD-10-CM | POA: Diagnosis not present

## 2017-02-10 DIAGNOSIS — M1711 Unilateral primary osteoarthritis, right knee: Secondary | ICD-10-CM | POA: Diagnosis not present

## 2017-02-10 DIAGNOSIS — M25561 Pain in right knee: Secondary | ICD-10-CM | POA: Diagnosis not present

## 2017-02-10 DIAGNOSIS — M7521 Bicipital tendinitis, right shoulder: Secondary | ICD-10-CM | POA: Diagnosis not present

## 2017-03-03 DIAGNOSIS — E1029 Type 1 diabetes mellitus with other diabetic kidney complication: Secondary | ICD-10-CM | POA: Diagnosis not present

## 2017-03-03 DIAGNOSIS — E785 Hyperlipidemia, unspecified: Secondary | ICD-10-CM | POA: Diagnosis not present

## 2017-03-03 DIAGNOSIS — R809 Proteinuria, unspecified: Secondary | ICD-10-CM | POA: Diagnosis not present

## 2017-03-09 DIAGNOSIS — R809 Proteinuria, unspecified: Secondary | ICD-10-CM | POA: Diagnosis not present

## 2017-03-09 DIAGNOSIS — Z4681 Encounter for fitting and adjustment of insulin pump: Secondary | ICD-10-CM | POA: Diagnosis not present

## 2017-03-09 DIAGNOSIS — E1042 Type 1 diabetes mellitus with diabetic polyneuropathy: Secondary | ICD-10-CM | POA: Diagnosis not present

## 2017-03-09 DIAGNOSIS — E10649 Type 1 diabetes mellitus with hypoglycemia without coma: Secondary | ICD-10-CM | POA: Diagnosis not present

## 2017-03-09 DIAGNOSIS — I10 Essential (primary) hypertension: Secondary | ICD-10-CM | POA: Diagnosis not present

## 2017-03-09 DIAGNOSIS — E1029 Type 1 diabetes mellitus with other diabetic kidney complication: Secondary | ICD-10-CM | POA: Diagnosis not present

## 2017-03-09 DIAGNOSIS — E785 Hyperlipidemia, unspecified: Secondary | ICD-10-CM | POA: Diagnosis not present

## 2017-04-06 DIAGNOSIS — E1029 Type 1 diabetes mellitus with other diabetic kidney complication: Secondary | ICD-10-CM | POA: Diagnosis not present

## 2017-04-20 ENCOUNTER — Other Ambulatory Visit: Payer: Self-pay | Admitting: Nurse Practitioner

## 2017-04-20 DIAGNOSIS — Z23 Encounter for immunization: Secondary | ICD-10-CM | POA: Diagnosis not present

## 2017-04-20 DIAGNOSIS — E109 Type 1 diabetes mellitus without complications: Secondary | ICD-10-CM | POA: Diagnosis not present

## 2017-04-20 DIAGNOSIS — Z1231 Encounter for screening mammogram for malignant neoplasm of breast: Secondary | ICD-10-CM | POA: Diagnosis not present

## 2017-04-20 DIAGNOSIS — Z79899 Other long term (current) drug therapy: Secondary | ICD-10-CM | POA: Diagnosis not present

## 2017-04-20 DIAGNOSIS — Z0000 Encounter for general adult medical examination without abnormal findings: Secondary | ICD-10-CM | POA: Diagnosis not present

## 2017-04-20 DIAGNOSIS — I10 Essential (primary) hypertension: Secondary | ICD-10-CM | POA: Diagnosis not present

## 2017-04-20 DIAGNOSIS — E7800 Pure hypercholesterolemia, unspecified: Secondary | ICD-10-CM | POA: Diagnosis not present

## 2017-05-04 DIAGNOSIS — H40052 Ocular hypertension, left eye: Secondary | ICD-10-CM | POA: Diagnosis not present

## 2017-05-11 DIAGNOSIS — E119 Type 2 diabetes mellitus without complications: Secondary | ICD-10-CM | POA: Diagnosis not present

## 2017-05-15 DIAGNOSIS — M159 Polyosteoarthritis, unspecified: Secondary | ICD-10-CM | POA: Diagnosis not present

## 2017-05-18 ENCOUNTER — Ambulatory Visit
Admission: RE | Admit: 2017-05-18 | Discharge: 2017-05-18 | Disposition: A | Discharge status: Home / Self Care | Payer: PPO | Source: Ambulatory Visit | Attending: Nurse Practitioner | Admitting: Nurse Practitioner

## 2017-05-18 DIAGNOSIS — Z1231 Encounter for screening mammogram for malignant neoplasm of breast: Secondary | ICD-10-CM | POA: Insufficient documentation

## 2017-06-16 DIAGNOSIS — E10649 Type 1 diabetes mellitus with hypoglycemia without coma: Secondary | ICD-10-CM | POA: Diagnosis not present

## 2017-06-16 DIAGNOSIS — Z23 Encounter for immunization: Secondary | ICD-10-CM | POA: Diagnosis not present

## 2017-06-16 DIAGNOSIS — E1029 Type 1 diabetes mellitus with other diabetic kidney complication: Secondary | ICD-10-CM | POA: Diagnosis not present

## 2017-06-16 DIAGNOSIS — E785 Hyperlipidemia, unspecified: Secondary | ICD-10-CM | POA: Diagnosis not present

## 2017-06-16 DIAGNOSIS — R809 Proteinuria, unspecified: Secondary | ICD-10-CM | POA: Diagnosis not present

## 2017-06-16 DIAGNOSIS — Z4681 Encounter for fitting and adjustment of insulin pump: Secondary | ICD-10-CM | POA: Diagnosis not present

## 2017-06-16 DIAGNOSIS — E1042 Type 1 diabetes mellitus with diabetic polyneuropathy: Secondary | ICD-10-CM | POA: Diagnosis not present

## 2017-07-13 DIAGNOSIS — E1029 Type 1 diabetes mellitus with other diabetic kidney complication: Secondary | ICD-10-CM | POA: Diagnosis not present

## 2017-08-25 DIAGNOSIS — H409 Unspecified glaucoma: Secondary | ICD-10-CM

## 2017-08-25 HISTORY — DX: Unspecified glaucoma: H40.9

## 2017-09-15 DIAGNOSIS — E785 Hyperlipidemia, unspecified: Secondary | ICD-10-CM | POA: Diagnosis not present

## 2017-09-15 DIAGNOSIS — E1042 Type 1 diabetes mellitus with diabetic polyneuropathy: Secondary | ICD-10-CM | POA: Diagnosis not present

## 2017-09-15 DIAGNOSIS — Z4681 Encounter for fitting and adjustment of insulin pump: Secondary | ICD-10-CM | POA: Diagnosis not present

## 2017-09-15 DIAGNOSIS — E10649 Type 1 diabetes mellitus with hypoglycemia without coma: Secondary | ICD-10-CM | POA: Diagnosis not present

## 2017-09-15 DIAGNOSIS — R809 Proteinuria, unspecified: Secondary | ICD-10-CM | POA: Diagnosis not present

## 2017-09-15 DIAGNOSIS — E1029 Type 1 diabetes mellitus with other diabetic kidney complication: Secondary | ICD-10-CM | POA: Diagnosis not present

## 2017-10-23 DIAGNOSIS — E10649 Type 1 diabetes mellitus with hypoglycemia without coma: Secondary | ICD-10-CM | POA: Diagnosis not present

## 2017-10-23 DIAGNOSIS — E1029 Type 1 diabetes mellitus with other diabetic kidney complication: Secondary | ICD-10-CM | POA: Diagnosis not present

## 2017-11-01 IMAGING — MG MM DIGITAL SCREENING BILAT W/ CAD
4 series · 4 of 4 positions shown · non-contrast
Comparison: Previous exam(s).

CLINICAL DATA: Screening.

EXAM:
DIGITAL SCREENING BILATERAL MAMMOGRAM WITH CAD

[R CC]
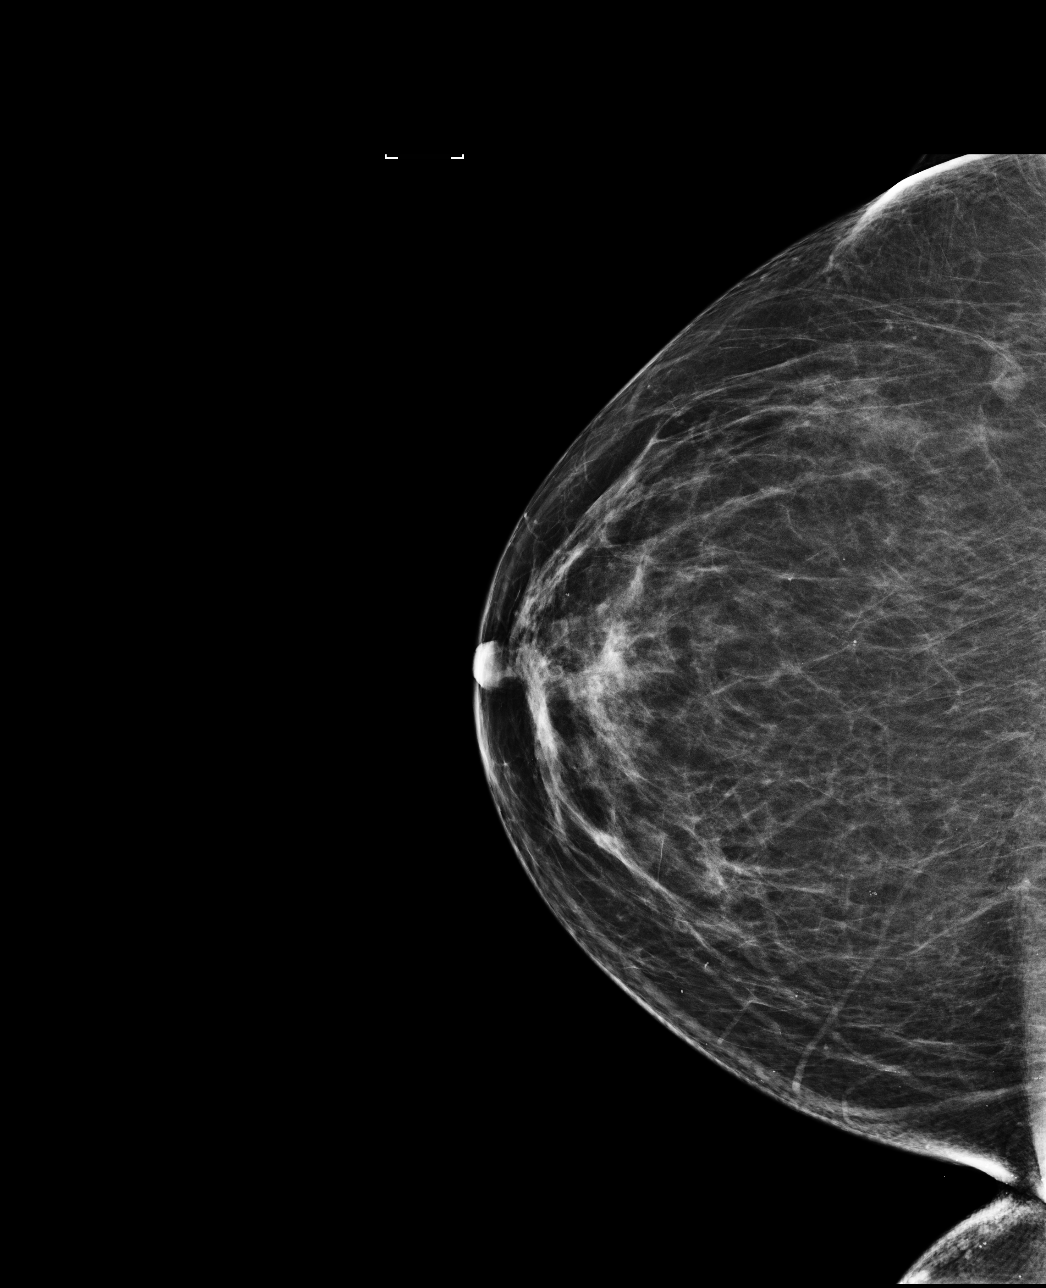

[L CC]
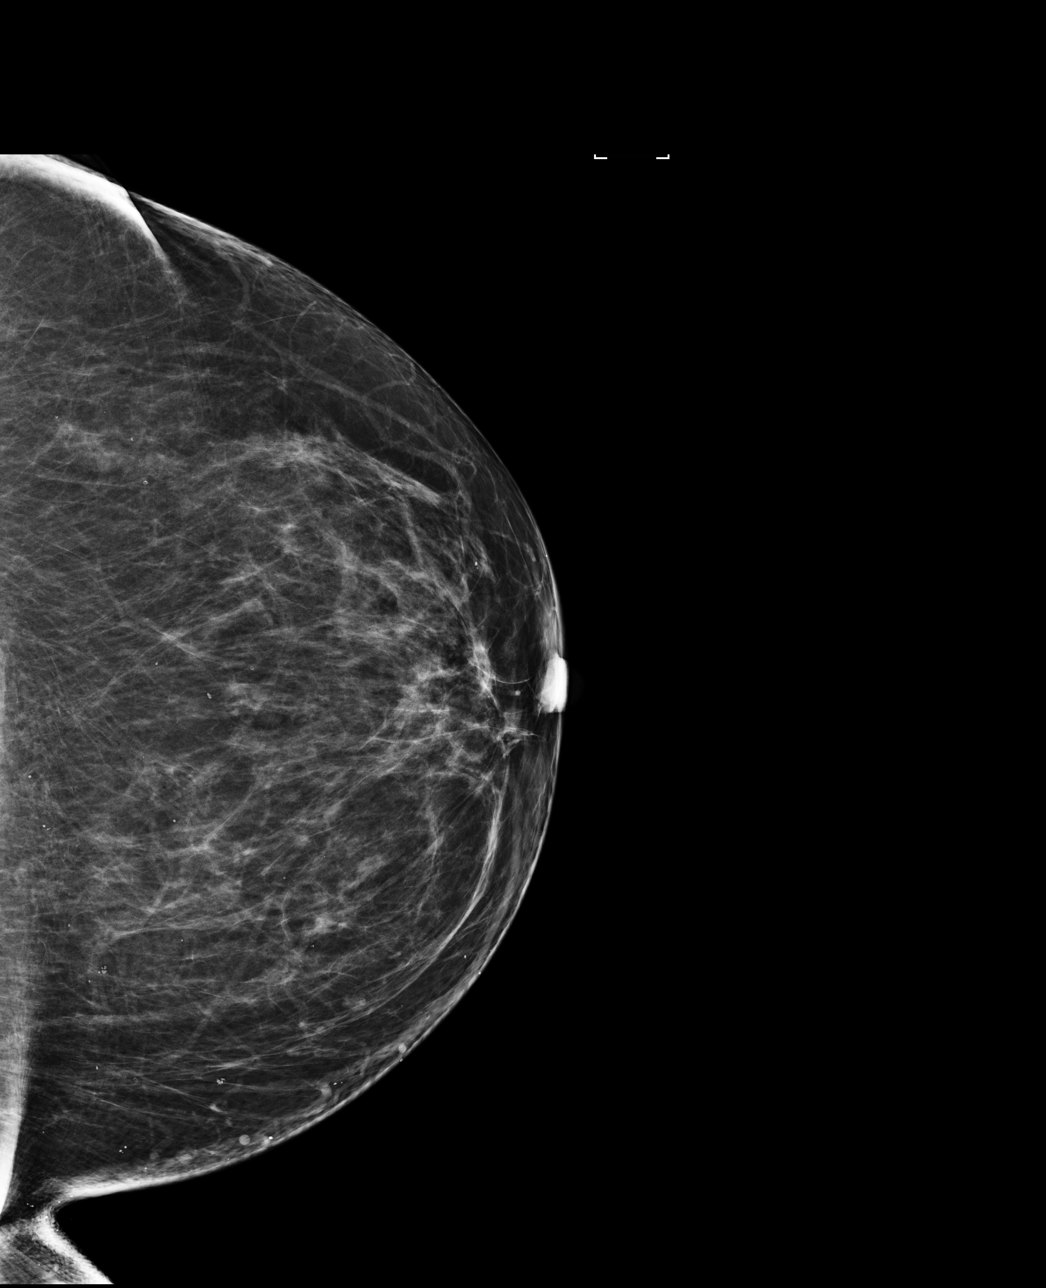

[R MLO]
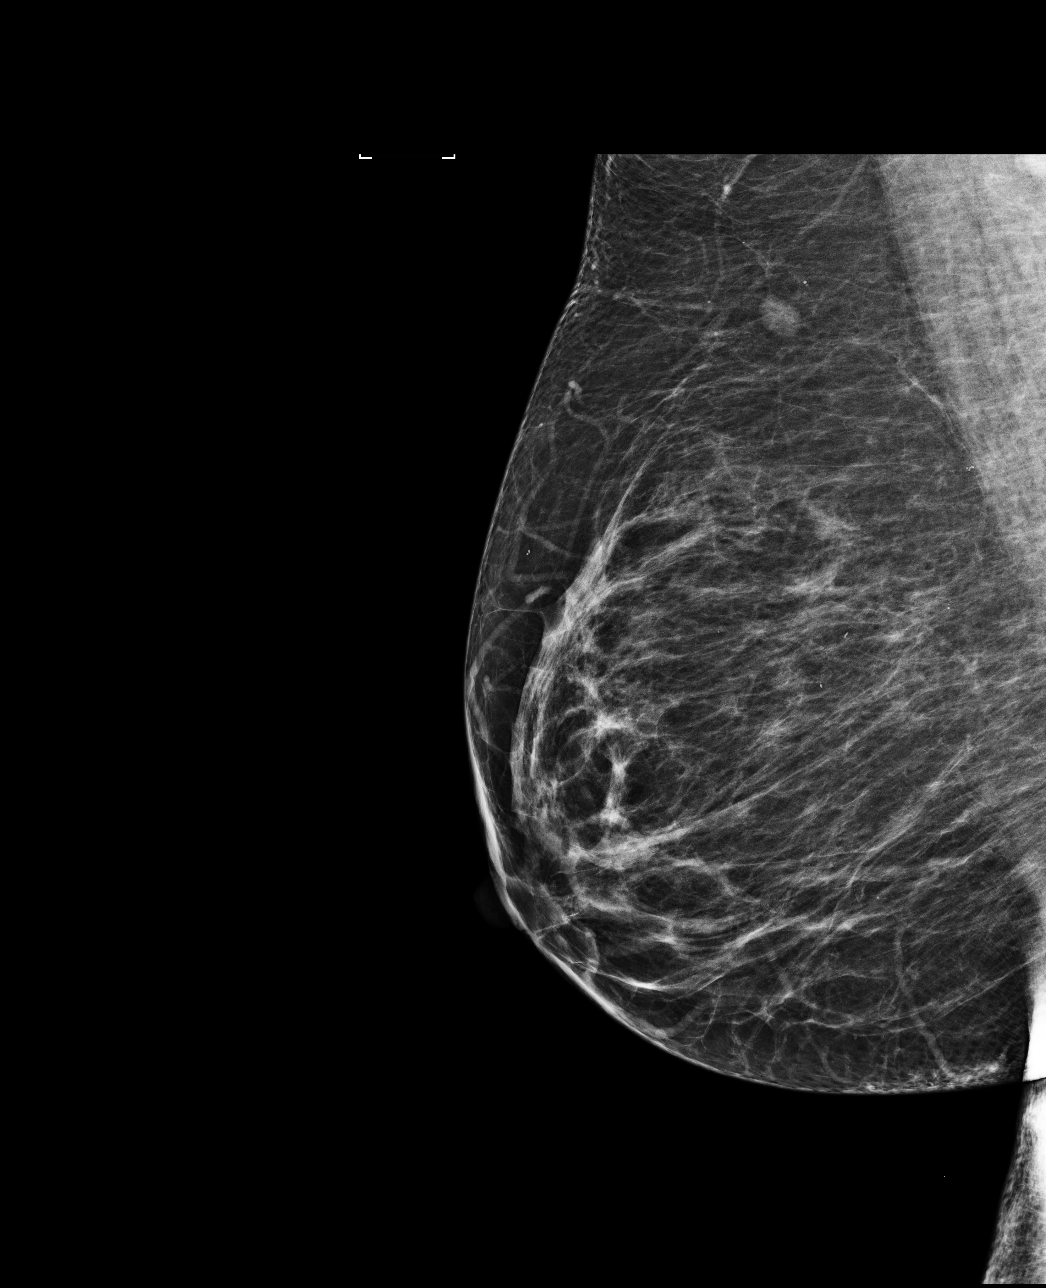

[L MLO]
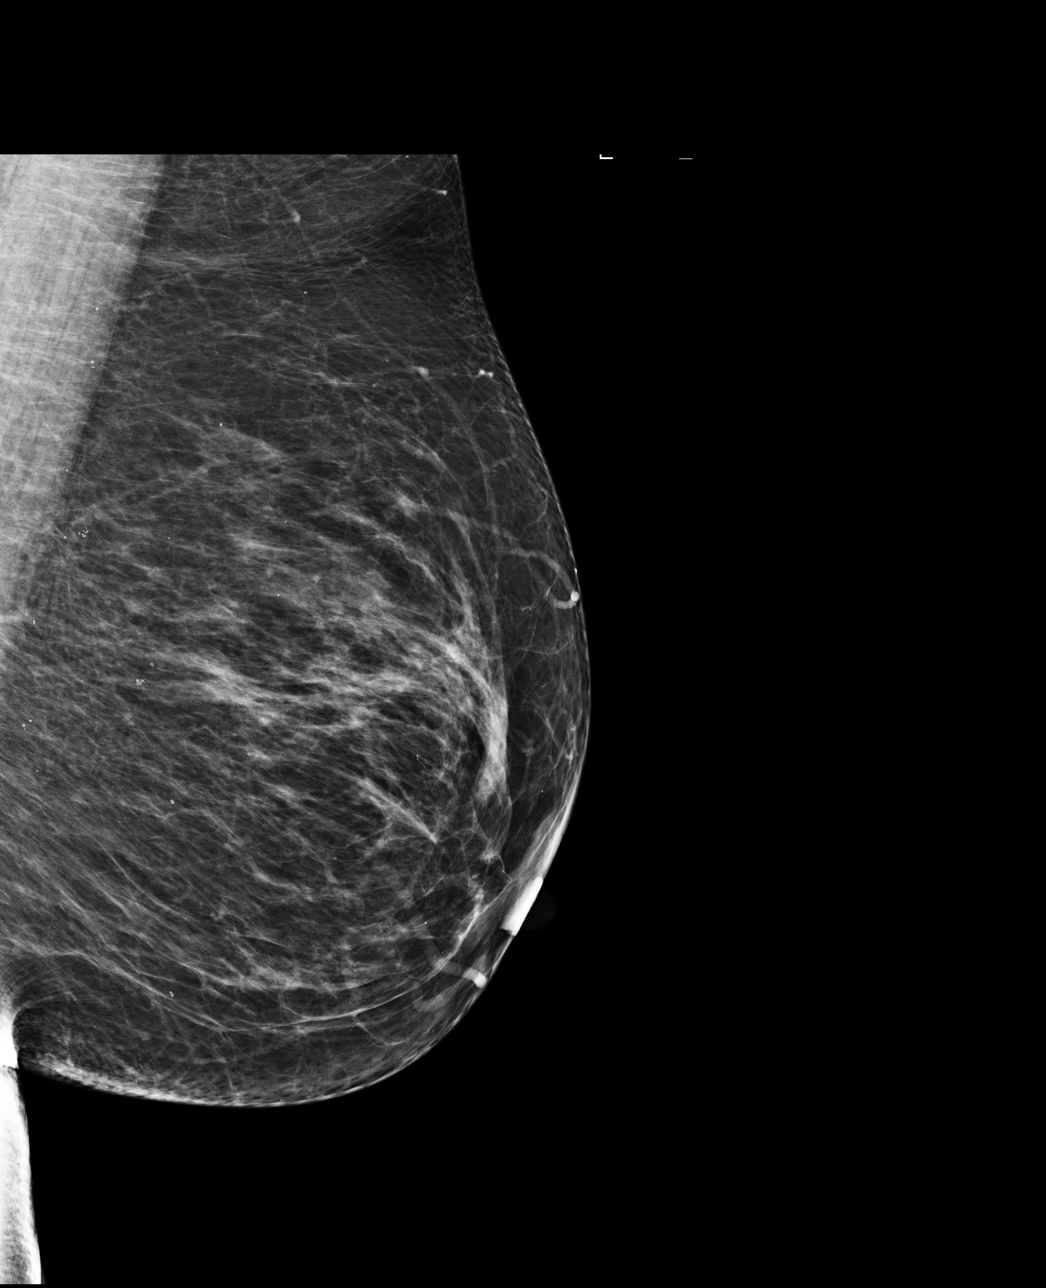

[4 of 4 positions shown; findings below may reference images not displayed]

ACR Breast Density Category b: There are scattered areas of
fibroglandular density.
FINDINGS: There are no findings suspicious for malignancy. Images were
processed with CAD.
IMPRESSION: No mammographic evidence of malignancy. A result letter of this
screening mammogram will be mailed directly to the patient.

RECOMMENDATION:
Screening mammogram in one year. (Code:AS-G-LCT)

BI-RADS CATEGORY  1: Negative.

## 2017-11-09 DIAGNOSIS — H40052 Ocular hypertension, left eye: Secondary | ICD-10-CM | POA: Diagnosis not present

## 2017-12-15 DIAGNOSIS — E785 Hyperlipidemia, unspecified: Secondary | ICD-10-CM | POA: Diagnosis not present

## 2017-12-15 DIAGNOSIS — Z4681 Encounter for fitting and adjustment of insulin pump: Secondary | ICD-10-CM | POA: Diagnosis not present

## 2017-12-15 DIAGNOSIS — E1029 Type 1 diabetes mellitus with other diabetic kidney complication: Secondary | ICD-10-CM | POA: Diagnosis not present

## 2017-12-15 DIAGNOSIS — E10649 Type 1 diabetes mellitus with hypoglycemia without coma: Secondary | ICD-10-CM | POA: Diagnosis not present

## 2017-12-15 DIAGNOSIS — E1042 Type 1 diabetes mellitus with diabetic polyneuropathy: Secondary | ICD-10-CM | POA: Diagnosis not present

## 2017-12-15 DIAGNOSIS — I10 Essential (primary) hypertension: Secondary | ICD-10-CM | POA: Diagnosis not present

## 2017-12-15 DIAGNOSIS — R809 Proteinuria, unspecified: Secondary | ICD-10-CM | POA: Diagnosis not present

## 2018-01-05 ENCOUNTER — Other Ambulatory Visit: Payer: Self-pay

## 2018-01-05 DIAGNOSIS — N390 Urinary tract infection, site not specified: Secondary | ICD-10-CM | POA: Diagnosis not present

## 2018-01-05 DIAGNOSIS — E109 Type 1 diabetes mellitus without complications: Secondary | ICD-10-CM | POA: Diagnosis not present

## 2018-01-05 DIAGNOSIS — R300 Dysuria: Secondary | ICD-10-CM | POA: Diagnosis not present

## 2018-01-05 NOTE — Patient Outreach (Signed)
[facility]) Care Management  01/05/2018  Cindy Sandoval 1950/08/30 [id number]   Referral received on 01/04/18. Client called the 24 hour nurse advice line on 01/04/18 with complaint of possible UTI; hurting a little bit; blood and pain when urinating with pressure. Recommendation by the 24 hour advice nurse was to see a physician within 4 hours.  67 year old with history of diabetes type I.  RNCM called to follow up. Client states that she went to see her primary care provider today and she was prescribed antibiotic, Nitrofurantoin 100mg  every 12 hours for urinary tract infection.   RNCM discussed care management program. She denies any questions or concerns. She denies any additional care management needs.  Plan: RNCM will send information on [facility] Care management. RNCM will not open case.  Thea Silversmith, RN, MSN, [place] Coordinator Cell: [phone]

## 2018-02-02 DIAGNOSIS — E10649 Type 1 diabetes mellitus with hypoglycemia without coma: Secondary | ICD-10-CM | POA: Diagnosis not present

## 2018-02-02 DIAGNOSIS — E1029 Type 1 diabetes mellitus with other diabetic kidney complication: Secondary | ICD-10-CM | POA: Diagnosis not present

## 2018-05-07 ENCOUNTER — Other Ambulatory Visit: Payer: Self-pay | Admitting: Nurse Practitioner

## 2018-05-07 DIAGNOSIS — Z1231 Encounter for screening mammogram for malignant neoplasm of breast: Secondary | ICD-10-CM

## 2018-05-19 ENCOUNTER — Ambulatory Visit
Admission: RE | Admit: 2018-05-19 | Discharge: 2018-05-19 | Disposition: A | Discharge status: Home / Self Care | Payer: Medicare Other | Source: Ambulatory Visit | Attending: Nurse Practitioner | Admitting: Nurse Practitioner

## 2018-05-19 ENCOUNTER — Encounter (INDEPENDENT_AMBULATORY_CARE_PROVIDER_SITE_OTHER): Payer: Self-pay

## 2018-05-19 DIAGNOSIS — Z1231 Encounter for screening mammogram for malignant neoplasm of breast: Secondary | ICD-10-CM | POA: Insufficient documentation

## 2018-06-17 DIAGNOSIS — H35033 Hypertensive retinopathy, bilateral: Secondary | ICD-10-CM | POA: Insufficient documentation

## 2018-06-17 DIAGNOSIS — E103593 Type 1 diabetes mellitus with proliferative diabetic retinopathy without macular edema, bilateral: Secondary | ICD-10-CM | POA: Insufficient documentation

## 2018-07-25 HISTORY — PX: PANRETINAL PHOTOCOAGULATION: SHX2160

## 2018-08-08 NOTE — Discharge Instructions (Signed)
°  Instructions after Total Knee Replacement ° ° Trev Boley P. Kimberla Driskill, Jr., M.D.    ° Dept. of Orthopaedics & Sports Medicine ° [facility] ° [address] ° , [place]  [address] ° Phone: [phone]   Fax: [phone] ° °  °DIET: °• Drink plenty of non-alcoholic fluids. °• Resume your normal diet. Include foods high in fiber. ° °ACTIVITY:  °• You may use crutches or a walker with weight-bearing as tolerated, unless instructed otherwise. °• You may be weaned off of the walker or crutches by your Physical Therapist.  °• Do NOT place pillows under the knee. Anything placed under the knee could limit your ability to straighten the knee.   °• Continue doing gentle exercises. Exercising will reduce the pain and swelling, increase motion, and prevent muscle weakness.   °• Please continue to use the TED compression stockings for 6 weeks. You may remove the stockings at night, but should reapply them in the morning. °• Do not drive or operate any equipment until instructed. ° °WOUND CARE:  °• Continue to use the PolarCare or ice packs periodically to reduce pain and swelling. °• You may bathe or shower after the staples are removed at the first office visit following surgery. ° °MEDICATIONS: °• You may resume your regular medications. °• Please take the pain medication as prescribed on the medication. °• Do not take pain medication on an empty stomach. °• You have been given a prescription for a blood thinner (Lovenox or Coumadin). Please take the medication as instructed. (NOTE: After completing a 2 week course of Lovenox, take one Enteric-coated aspirin once a day. This along with elevation will help reduce the possibility of phlebitis in your operated leg.) °• Do not drive or drink alcoholic beverages when taking pain medications. ° °CALL THE OFFICE FOR: °• Temperature above 101 degrees °• Excessive bleeding or drainage on the dressing. °• Excessive swelling, coldness, or paleness of the toes. °• Persistent  nausea and vomiting. ° °FOLLOW-UP:  °• You should have an appointment to return to the office in 10-14 days after surgery. °• Arrangements have been made for continuation of Physical Therapy (either home therapy or outpatient therapy). °  °

## 2018-08-25 DIAGNOSIS — Z9641 Presence of insulin pump (external) (internal): Secondary | ICD-10-CM

## 2018-08-25 HISTORY — DX: Presence of insulin pump (external) (internal): Z96.41

## 2018-09-01 ENCOUNTER — Other Ambulatory Visit: Payer: Self-pay

## 2018-09-01 ENCOUNTER — Encounter
Admission: RE | Admit: 2018-09-01 | Discharge: 2018-09-01 | Disposition: A | Discharge status: Home / Self Care | Payer: Medicare Other | Source: Ambulatory Visit | Attending: Orthopedic Surgery | Admitting: Orthopedic Surgery

## 2018-09-01 DIAGNOSIS — R809 Proteinuria, unspecified: Secondary | ICD-10-CM | POA: Insufficient documentation

## 2018-09-01 DIAGNOSIS — E1069 Type 1 diabetes mellitus with other specified complication: Secondary | ICD-10-CM | POA: Insufficient documentation

## 2018-09-01 DIAGNOSIS — I10 Essential (primary) hypertension: Secondary | ICD-10-CM | POA: Diagnosis not present

## 2018-09-01 DIAGNOSIS — N390 Urinary tract infection, site not specified: Secondary | ICD-10-CM | POA: Diagnosis not present

## 2018-09-01 DIAGNOSIS — Z01818 Encounter for other preprocedural examination: Secondary | ICD-10-CM | POA: Insufficient documentation

## 2018-09-01 HISTORY — DX: Presence of dental prosthetic device (complete) (partial): Z97.2

## 2018-09-01 HISTORY — DX: Zoster without complications: B02.9

## 2018-09-01 HISTORY — DX: Unspecified glaucoma: H40.9

## 2018-09-01 HISTORY — DX: Unspecified asthma, uncomplicated: J45.909

## 2018-09-01 HISTORY — DX: Presence of insulin pump (external) (internal): Z96.41

## 2018-09-01 LAB — COMPREHENSIVE METABOLIC PANEL
ALT: 15 U/L (ref 0–44)
AST: 18 U/L (ref 15–41)
Albumin: 4.1 g/dL (ref 3.5–5.0)
Alkaline Phosphatase: 61 U/L (ref 38–126)
Anion gap: 9 (ref 5–15)
BUN: 12 mg/dL (ref 8–23)
CALCIUM: 9.1 mg/dL (ref 8.9–10.3)
CO2: 25 mmol/L (ref 22–32)
CREATININE: 0.64 mg/dL (ref 0.44–1.00)
Chloride: 96 mmol/L — ABNORMAL LOW (ref 98–111)
GFR calc Af Amer: >60 mL/min (ref >60)
GFR calc non Af Amer: >60 mL/min (ref >60)
Glucose, Bld: 163 mg/dL — ABNORMAL HIGH (ref 70–99)
Potassium: 4.9 mmol/L (ref 3.5–5.1)
Sodium: 130 mmol/L — ABNORMAL LOW (ref 135–145)
Total Bilirubin: 0.9 mg/dL (ref 0.3–1.2)
Total Protein: 7.5 g/dL (ref 6.5–8.1)

## 2018-09-01 LAB — CBC
HCT: 42.3 % (ref 36.0–46.0)
HEMOGLOBIN: 14.1 g/dL (ref 12.0–15.0)
MCH: 32.5 pg (ref 26.0–34.0)
MCHC: 33.3 g/dL (ref 30.0–36.0)
MCV: 97.5 fL (ref 80.0–100.0)
Platelets: 298 10*3/uL (ref 150–400)
RBC: 4.34 MIL/uL (ref 3.87–5.11)
RDW: 13.5 % (ref 11.5–15.5)
WBC: 7.8 10*3/uL (ref 4.0–10.5)
nRBC: 0 % (ref 0.0–0.2)

## 2018-09-01 LAB — URINALYSIS, ROUTINE W REFLEX MICROSCOPIC
Bilirubin Urine: NEGATIVE
Glucose, UA: NEGATIVE mg/dL
Hgb urine dipstick: NEGATIVE
Ketones, ur: NEGATIVE mg/dL
Leukocytes, UA: NEGATIVE
Nitrite: NEGATIVE
Protein, ur: NEGATIVE mg/dL
Specific Gravity, Urine: 1.008 (ref 1.005–1.030)
pH: 7 (ref 5.0–8.0)

## 2018-09-01 LAB — PROTIME-INR
INR: 0.94
Prothrombin Time: 12.5 seconds (ref 11.4–15.2)

## 2018-09-01 LAB — SURGICAL PCR SCREEN
MRSA, PCR: NEGATIVE
Staphylococcus aureus: NEGATIVE

## 2018-09-01 LAB — TYPE AND SCREEN
ABO/RH(D): O POS
Antibody Screen: NEGATIVE

## 2018-09-01 LAB — SEDIMENTATION RATE: Sed Rate: 7 mm/hr (ref 0–30)

## 2018-09-01 LAB — APTT: APTT: 32 s (ref 24–36)

## 2018-09-01 LAB — C-REACTIVE PROTEIN: CRP: <0.8 mg/dL (ref <1.0)

## 2018-09-01 NOTE — Pre-Procedure Instructions (Signed)
Recommendations from patients' endocrinologist regarding the insulin pump are placed on chart. Patient states that she is a very brittle diabetic. Instructed patient NOT to have anything solid after midnight as in a sugar pill, but to injest apple juice or gatorade if sugar too low. Patient states that she would shut the pump off if she felt she was becoming hypoglycemic.

## 2018-09-01 NOTE — Patient Instructions (Signed)
Your procedure is scheduled on: Monday, September 06, 2018  Report to [facility]    DO NOT STOP ON THE FIRST FLOOR TO REGISTER  To find out your arrival time please call [phone] between 1PM - 3PM on Friday, September 03, 2018  Remember: Instructions that are not followed completely may result in serious medical risk,  up to and including death, or upon the discretion of your surgeon and anesthesiologist your  surgery may need to be rescheduled.     _X__ 1. Do not eat food after midnight the night before your procedure.                 No gum chewing or hard candies.                     ABSOLUTELY NOTHING SOLID IN  YOUR MOUTH AFTER MIDNIGHT                  You may drink clear liquids up to 2 hours before you are scheduled to arrive for your surgery-                   DO not drink clear liquids within 2 hours of the start of your surgery.                  Clear Liquids include:  water, apple juice without pulp, clear carbohydrate                 drink such as Clearfast of Gatorade, Black Coffee or Tea (Do not add                 anything to coffee or tea). YOU MAY HAVE SUGAR IN YOUR COFFEE BUT NO DAIRY PRODUCTS.                    FOR DIABETICS, WE PREFER WATER  __X__2.  On the morning of surgery brush your teeth with toothpaste and water,                   You may rinse your mouth with mouthwash if you wish.                       Do not swallow any toothpaste of mouthwash.     _X__ 3.  No Alcohol for 24 hours before or after surgery.   _X__ 4.  Do Not Smoke or use e-cigarettes For 24 Hours Prior to Your Surgery.                 Do not use any chewable tobacco products for at least 6 hours prior to                 surgery.  ____  5.  Bring all medications with you on the day of surgery if instructed.   __X__  6.  Notify your doctor if there is any change in your medical condition      (cold, fever, infections).     Do not wear  jewelry, make-up, hairpins, clips or nail polish. Do not wear lotions, powders, or perfumes. You may wear deodorant. Do not shave 48 hours prior to surgery. Men may shave face and neck. Do not bring valuables to the hospital.    [facility] is not responsible for any belongings or valuables.  Contacts, dentures or bridgework may not be  worn into surgery. Leave your suitcase in the car. After surgery it may be brought to your room. For patients admitted to the hospital, discharge time is determined by your treatment team.   Patients discharged the day of surgery will not be allowed to drive home.   Please read over the following fact sheets that you were given:                      PREPARING FOR SURGERY                         MRSA: STOP THE SPREAD   _X__ Take these medicines the morning of surgery with A SIP OF WATER:    1. AMLODIPINE  2. PRILOSEC  3. ZYRTEC, IF NEEDED  4.  5.  6.  ____ Fleet Enema (as directed)   __X__ Use CHG Soap as directed  ____ Use inhalers on the day of surgery  ____ Stop metformin 2 days prior to surgery    _X___ Take 1/2 of usual insulin dose the night before surgery. No insulin the morning          of surgery. FOLLOW INSTRUCTIONS FROM YOUR ENDOCRINOLOGIST REGARDING THE              INSULIN PUMP  _X___ Stop ASPIRIN AS OF TODAY  _X___ Stop Anti-inflammatories AS OF TODAY               THIS INCLUDES NAPROXYN  / VOLTAREN GEL   ____ Stop supplements until after surgery.    ____ Bring C-Pap to the hospital.   CONTINUE TAKING LOSARTAN EVERYDAY, BUT DO NOT TAKE ON THE MORNING OF SURGERY.  CONTINUE TAKING NIGHTLY PRAVASTATIN EACH NIGHT PRIOR TO SURGERY  HAVE STOOL SOFTENERS AT HOME TO USE AFTER SURGERY.     BEGIN TAKING A DAY OR TWO PRIOR TO SURGERY  WEAR COMFORTABLE CLOTHING TO [place].  HAVE STABLE SHOES TO WALK WITH IN THE HOSPITAL.

## 2018-09-01 NOTE — Pre-Procedure Instructions (Signed)
Care Everywhere Result Report Hemoglobin A1CLast Updated: 11/01/2015 [place] Component Name Value Ref Range  Hemoglobin A1C 6.7 (H) 4.2 - 5.6 %  Average Blood Glucose (Calc) 146 mg/dL  Specimen Collected on  Blood Unknown  Result Narrative   Normal Range:4.2 - 5.6% Increased Risk:5.7 - 6.4% Diabetes:>= 6.5% Glycemic Control for adults with diabetes:<7%

## 2018-09-02 LAB — URINE CULTURE
Culture: NO GROWTH
Special Requests: NORMAL

## 2018-09-03 LAB — IGE: IgE (Immunoglobulin E), Serum: 50 IU/mL (ref 6–495)

## 2018-09-06 ENCOUNTER — Encounter
Admission: RE | Disposition: A | Discharge status: Home Health | Payer: Self-pay | Source: Home / Self Care | Attending: Orthopedic Surgery

## 2018-09-06 ENCOUNTER — Inpatient Hospital Stay: Payer: Medicare Other | Admitting: Certified Registered Nurse Anesthetist

## 2018-09-06 ENCOUNTER — Other Ambulatory Visit: Payer: Self-pay

## 2018-09-06 ENCOUNTER — Inpatient Hospital Stay
Admission: RE | Admit: 2018-09-06 | Discharge: 2018-09-08 | DRG: 470 | Disposition: A | Discharge status: Home Health | Payer: Medicare Other | Attending: Orthopedic Surgery | Admitting: Orthopedic Surgery

## 2018-09-06 ENCOUNTER — Inpatient Hospital Stay: Payer: Medicare Other

## 2018-09-06 ENCOUNTER — Encounter: Payer: Self-pay | Admitting: Orthopedic Surgery

## 2018-09-06 DIAGNOSIS — I10 Essential (primary) hypertension: Secondary | ICD-10-CM | POA: Diagnosis present

## 2018-09-06 DIAGNOSIS — E785 Hyperlipidemia, unspecified: Secondary | ICD-10-CM | POA: Diagnosis present

## 2018-09-06 DIAGNOSIS — Z9641 Presence of insulin pump (external) (internal): Secondary | ICD-10-CM | POA: Diagnosis present

## 2018-09-06 DIAGNOSIS — Z96659 Presence of unspecified artificial knee joint: Secondary | ICD-10-CM

## 2018-09-06 DIAGNOSIS — H409 Unspecified glaucoma: Secondary | ICD-10-CM | POA: Diagnosis present

## 2018-09-06 DIAGNOSIS — R809 Proteinuria, unspecified: Secondary | ICD-10-CM

## 2018-09-06 DIAGNOSIS — J45909 Unspecified asthma, uncomplicated: Secondary | ICD-10-CM | POA: Diagnosis present

## 2018-09-06 DIAGNOSIS — E11618 Type 2 diabetes mellitus with other diabetic arthropathy: Secondary | ICD-10-CM | POA: Diagnosis present

## 2018-09-06 DIAGNOSIS — M1711 Unilateral primary osteoarthritis, right knee: Principal | ICD-10-CM | POA: Diagnosis present

## 2018-09-06 DIAGNOSIS — K219 Gastro-esophageal reflux disease without esophagitis: Secondary | ICD-10-CM | POA: Diagnosis present

## 2018-09-06 DIAGNOSIS — E1029 Type 1 diabetes mellitus with other diabetic kidney complication: Secondary | ICD-10-CM | POA: Insufficient documentation

## 2018-09-06 DIAGNOSIS — E1069 Type 1 diabetes mellitus with other specified complication: Secondary | ICD-10-CM | POA: Insufficient documentation

## 2018-09-06 DIAGNOSIS — E103593 Type 1 diabetes mellitus with proliferative diabetic retinopathy without macular edema, bilateral: Secondary | ICD-10-CM | POA: Diagnosis present

## 2018-09-06 HISTORY — PX: KNEE ARTHROPLASTY: SHX992

## 2018-09-06 LAB — ABO/RH: ABO/RH(D): O POS

## 2018-09-06 LAB — GLUCOSE, CAPILLARY
Glucose-Capillary: 244 mg/dL — ABNORMAL HIGH (ref 70–99)
Glucose-Capillary: 272 mg/dL — ABNORMAL HIGH (ref 70–99)
Glucose-Capillary: 327 mg/dL — ABNORMAL HIGH (ref 70–99)
Glucose-Capillary: 297 mg/dL — ABNORMAL HIGH (ref 70–99)
Glucose-Capillary: 261 mg/dL — ABNORMAL HIGH (ref 70–99)
Glucose-Capillary: 197 mg/dL — ABNORMAL HIGH (ref 70–99)
Glucose-Capillary: 163 mg/dL — ABNORMAL HIGH (ref 70–99)
Glucose-Capillary: 144 mg/dL — ABNORMAL HIGH (ref 70–99)
Glucose-Capillary: 234 mg/dL — ABNORMAL HIGH (ref 70–99)

## 2018-09-06 SURGERY — ARTHROPLASTY, KNEE, TOTAL, USING IMAGELESS COMPUTER-ASSISTED NAVIGATION
Anesthesia: Spinal | Laterality: Right

## 2018-09-06 MED ORDER — TRAMADOL HCL 50 MG PO TABS
50.0000 mg | ORAL_TABLET | ORAL | Status: DC | PRN
  Administered 2018-09-07: 50 mg via ORAL
  Filled 2018-09-06: qty 1

## 2018-09-06 MED ORDER — METOCLOPRAMIDE HCL 5 MG/ML IJ SOLN: 5.0000 mg | Freq: Three times a day (TID) | INTRAMUSCULAR | Status: DC | PRN

## 2018-09-06 MED ORDER — LOSARTAN POTASSIUM 50 MG PO TABS
100.0000 mg | ORAL_TABLET | Freq: Every day | ORAL | Status: DC
  Administered 2018-09-07 – 2018-09-08 (×2): 100 mg via ORAL
  Filled 2018-09-06 (×2): qty 2

## 2018-09-06 MED ORDER — FERROUS SULFATE 325 (65 FE) MG PO TABS
325.0000 mg | ORAL_TABLET | Freq: Two times a day (BID) | ORAL | Status: DC
  Administered 2018-09-06 – 2018-09-08 (×4): 325 mg via ORAL
  Filled 2018-09-06 (×4): qty 1

## 2018-09-06 MED ORDER — CHLORHEXIDINE GLUCONATE 4 % EX LIQD: 60.0000 mL | Freq: Once | CUTANEOUS | Status: DC

## 2018-09-06 MED ORDER — DEXAMETHASONE SODIUM PHOSPHATE 10 MG/ML IJ SOLN
INTRAMUSCULAR | Status: AC
  Administered 2018-09-06: 8 mg via INTRAVENOUS
  Filled 2018-09-06: qty 1

## 2018-09-06 MED ORDER — CELECOXIB 200 MG PO CAPS
400.0000 mg | ORAL_CAPSULE | Freq: Once | ORAL | Status: AC
  Administered 2018-09-06: 400 mg via ORAL

## 2018-09-06 MED ORDER — GABAPENTIN 300 MG PO CAPS
300.0000 mg | ORAL_CAPSULE | Freq: Once | ORAL | Status: AC
  Administered 2018-09-06: 300 mg via ORAL

## 2018-09-06 MED ORDER — OXYCODONE HCL 5 MG PO TABS
5.0000 mg | ORAL_TABLET | ORAL | Status: DC | PRN
  Administered 2018-09-06 – 2018-09-08 (×4): 5 mg via ORAL
  Filled 2018-09-06 (×3): qty 1

## 2018-09-06 MED ORDER — PHENYLEPHRINE HCL 10 MG/ML IJ SOLN
INTRAMUSCULAR | Status: AC
  Filled 2018-09-06: qty 2

## 2018-09-06 MED ORDER — FENTANYL CITRATE (PF) 100 MCG/2ML IJ SOLN
25.0000 ug | INTRAMUSCULAR | Status: DC | PRN
  Administered 2018-09-06 (×2): 25 ug via INTRAVENOUS

## 2018-09-06 MED ORDER — INSULIN ASPART 100 UNIT/ML ~~LOC~~ SOLN
SUBCUTANEOUS | Status: AC
  Filled 2018-09-06: qty 1

## 2018-09-06 MED ORDER — INSULIN ASPART 100 UNIT/ML ~~LOC~~ SOLN
4.0000 [IU] | Freq: Once | SUBCUTANEOUS | Status: AC
  Administered 2018-09-06: 4 [IU] via SUBCUTANEOUS

## 2018-09-06 MED ORDER — GABAPENTIN 300 MG PO CAPS
300.0000 mg | ORAL_CAPSULE | Freq: Every day | ORAL | Status: DC
  Administered 2018-09-06 – 2018-09-07 (×2): 300 mg via ORAL
  Filled 2018-09-06 (×2): qty 1

## 2018-09-06 MED ORDER — CLINDAMYCIN PHOSPHATE 900 MG/50ML IV SOLN
900.0000 mg | INTRAVENOUS | Status: AC
  Administered 2018-09-06: 900 mg via INTRAVENOUS

## 2018-09-06 MED ORDER — METOCLOPRAMIDE HCL 10 MG PO TABS
10.0000 mg | ORAL_TABLET | Freq: Three times a day (TID) | ORAL | Status: DC
  Administered 2018-09-06 – 2018-09-07 (×4): 10 mg via ORAL
  Filled 2018-09-06 (×5): qty 1

## 2018-09-06 MED ORDER — MAGNESIUM HYDROXIDE 400 MG/5ML PO SUSP
30.0000 mL | Freq: Every day | ORAL | Status: DC
  Administered 2018-09-07: 30 mL via ORAL
  Filled 2018-09-06: qty 30

## 2018-09-06 MED ORDER — SODIUM CHLORIDE 0.9 % IV SOLN
INTRAVENOUS | Status: DC
  Administered 2018-09-06 – 2018-09-07 (×2): via INTRAVENOUS

## 2018-09-06 MED ORDER — ONDANSETRON HCL 4 MG/2ML IJ SOLN: 4.0000 mg | Freq: Four times a day (QID) | INTRAMUSCULAR | Status: DC | PRN

## 2018-09-06 MED ORDER — ACETAMINOPHEN 10 MG/ML IV SOLN
INTRAVENOUS | Status: DC | PRN
  Administered 2018-09-06: 1000 mg via INTRAVENOUS

## 2018-09-06 MED ORDER — PHENOL 1.4 % MT LIQD: 1.0000 | OROMUCOSAL | Status: DC | PRN

## 2018-09-06 MED ORDER — ONDANSETRON HCL 4 MG PO TABS: 4.0000 mg | ORAL_TABLET | Freq: Four times a day (QID) | ORAL | Status: DC | PRN

## 2018-09-06 MED ORDER — ACETAMINOPHEN 10 MG/ML IV SOLN
INTRAVENOUS | Status: AC
  Filled 2018-09-06: qty 100

## 2018-09-06 MED ORDER — LORATADINE 10 MG PO TABS
10.0000 mg | ORAL_TABLET | Freq: Every day | ORAL | Status: DC
  Filled 2018-09-06 (×2): qty 1

## 2018-09-06 MED ORDER — FENTANYL CITRATE (PF) 100 MCG/2ML IJ SOLN
INTRAMUSCULAR | Status: AC
  Filled 2018-09-06: qty 2

## 2018-09-06 MED ORDER — PROPOFOL 500 MG/50ML IV EMUL
INTRAVENOUS | Status: AC
  Filled 2018-09-06: qty 50

## 2018-09-06 MED ORDER — FENTANYL CITRATE (PF) 100 MCG/2ML IJ SOLN
INTRAMUSCULAR | Status: AC
  Administered 2018-09-06: 25 ug via INTRAVENOUS
  Filled 2018-09-06: qty 2

## 2018-09-06 MED ORDER — GABAPENTIN 300 MG PO CAPS
ORAL_CAPSULE | ORAL | Status: AC
  Administered 2018-09-06: 300 mg via ORAL
  Filled 2018-09-06: qty 1

## 2018-09-06 MED ORDER — SENNOSIDES-DOCUSATE SODIUM 8.6-50 MG PO TABS
1.0000 | ORAL_TABLET | Freq: Two times a day (BID) | ORAL | Status: DC
  Administered 2018-09-06 – 2018-09-08 (×4): 1 via ORAL
  Filled 2018-09-06 (×4): qty 1

## 2018-09-06 MED ORDER — HYDROMORPHONE HCL 1 MG/ML IJ SOLN: 0.5000 mg | INTRAMUSCULAR | Status: DC | PRN

## 2018-09-06 MED ORDER — LIDOCAINE HCL (PF) 2 % IJ SOLN
INTRAMUSCULAR | Status: AC
  Filled 2018-09-06: qty 10

## 2018-09-06 MED ORDER — DEXAMETHASONE SODIUM PHOSPHATE 10 MG/ML IJ SOLN
8.0000 mg | Freq: Once | INTRAMUSCULAR | Status: AC
  Administered 2018-09-06: 8 mg via INTRAVENOUS

## 2018-09-06 MED ORDER — ACETAMINOPHEN 325 MG PO TABS: 325.0000 mg | ORAL_TABLET | Freq: Four times a day (QID) | ORAL | Status: DC | PRN

## 2018-09-06 MED ORDER — TETRACAINE HCL 1 % IJ SOLN
INTRAMUSCULAR | Status: DC | PRN
  Administered 2018-09-06: 4 mg via INTRASPINAL

## 2018-09-06 MED ORDER — PROPOFOL 500 MG/50ML IV EMUL
INTRAVENOUS | Status: DC | PRN
  Administered 2018-09-06: 65 ug/kg/min via INTRAVENOUS

## 2018-09-06 MED ORDER — ONDANSETRON HCL 4 MG/2ML IJ SOLN: 4.0000 mg | Freq: Once | INTRAMUSCULAR | Status: DC | PRN

## 2018-09-06 MED ORDER — OXYCODONE HCL 5 MG PO TABS
10.0000 mg | ORAL_TABLET | ORAL | Status: DC | PRN
  Administered 2018-09-07: 10 mg via ORAL
  Filled 2018-09-06 (×2): qty 2

## 2018-09-06 MED ORDER — ALUM & MAG HYDROXIDE-SIMETH 200-200-20 MG/5ML PO SUSP: 30.0000 mL | ORAL | Status: DC | PRN

## 2018-09-06 MED ORDER — CLINDAMYCIN PHOSPHATE 900 MG/50ML IV SOLN
INTRAVENOUS | Status: AC
  Filled 2018-09-06: qty 50

## 2018-09-06 MED ORDER — BISACODYL 10 MG RE SUPP: 10.0000 mg | Freq: Every day | RECTAL | Status: DC | PRN

## 2018-09-06 MED ORDER — CELECOXIB 200 MG PO CAPS
ORAL_CAPSULE | ORAL | Status: AC
  Administered 2018-09-06: 400 mg via ORAL
  Filled 2018-09-06: qty 2

## 2018-09-06 MED ORDER — INSULIN PUMP
Freq: Three times a day (TID) | SUBCUTANEOUS | Status: DC
  Administered 2018-09-06: 1 via SUBCUTANEOUS
  Administered 2018-09-06: 18:00:00 via SUBCUTANEOUS
  Administered 2018-09-07: 0.3 via SUBCUTANEOUS
  Administered 2018-09-07: 4.8 via SUBCUTANEOUS
  Administered 2018-09-07: 5 via SUBCUTANEOUS
  Administered 2018-09-08: 1.4 via SUBCUTANEOUS
  Administered 2018-09-08: 3 via SUBCUTANEOUS
  Filled 2018-09-06: qty 1

## 2018-09-06 MED ORDER — METOCLOPRAMIDE HCL 10 MG PO TABS: 5.0000 mg | ORAL_TABLET | Freq: Three times a day (TID) | ORAL | Status: DC | PRN

## 2018-09-06 MED ORDER — SODIUM CHLORIDE 0.9 % IV SOLN
INTRAVENOUS | Status: DC | PRN
  Administered 2018-09-06: 6 mL

## 2018-09-06 MED ORDER — TRANEXAMIC ACID-NACL 1000-0.7 MG/100ML-% IV SOLN
1000.0000 mg | INTRAVENOUS | Status: AC
  Administered 2018-09-06: 1000 mg via INTRAVENOUS
  Filled 2018-09-06: qty 100

## 2018-09-06 MED ORDER — SODIUM CHLORIDE 0.9 % IV SOLN
INTRAVENOUS | Status: DC | PRN
  Administered 2018-09-06: 30 ug/min via INTRAVENOUS

## 2018-09-06 MED ORDER — PRAVASTATIN SODIUM 20 MG PO TABS
20.0000 mg | ORAL_TABLET | Freq: Every day | ORAL | Status: DC
  Administered 2018-09-06 – 2018-09-07 (×2): 20 mg via ORAL
  Filled 2018-09-06 (×2): qty 1

## 2018-09-06 MED ORDER — PROPOFOL 10 MG/ML IV BOLUS
INTRAVENOUS | Status: DC | PRN
  Administered 2018-09-06 (×6): 16 mg via INTRAVENOUS

## 2018-09-06 MED ORDER — MENTHOL 3 MG MT LOZG: 1.0000 | LOZENGE | OROMUCOSAL | Status: DC | PRN

## 2018-09-06 MED ORDER — MIDAZOLAM HCL 2 MG/2ML IJ SOLN
INTRAMUSCULAR | Status: AC
  Filled 2018-09-06: qty 2

## 2018-09-06 MED ORDER — ENOXAPARIN SODIUM 30 MG/0.3ML ~~LOC~~ SOLN
30.0000 mg | Freq: Two times a day (BID) | SUBCUTANEOUS | Status: DC
  Administered 2018-09-07 – 2018-09-08 (×3): 30 mg via SUBCUTANEOUS
  Filled 2018-09-06 (×3): qty 0.3

## 2018-09-06 MED ORDER — AMLODIPINE BESYLATE 10 MG PO TABS
10.0000 mg | ORAL_TABLET | Freq: Every day | ORAL | Status: DC
  Administered 2018-09-07 – 2018-09-08 (×2): 10 mg via ORAL
  Filled 2018-09-06 (×2): qty 1

## 2018-09-06 MED ORDER — ACETAMINOPHEN 10 MG/ML IV SOLN
1000.0000 mg | Freq: Four times a day (QID) | INTRAVENOUS | Status: AC
  Administered 2018-09-06 – 2018-09-07 (×3): 1000 mg via INTRAVENOUS
  Filled 2018-09-06 (×4): qty 100

## 2018-09-06 MED ORDER — FENTANYL CITRATE (PF) 100 MCG/2ML IJ SOLN
INTRAMUSCULAR | Status: DC | PRN
  Administered 2018-09-06 (×2): 50 ug via INTRAVENOUS

## 2018-09-06 MED ORDER — SODIUM CHLORIDE 0.9 % IV SOLN
INTRAVENOUS | Status: DC | PRN
  Administered 2018-09-06: 60 mL

## 2018-09-06 MED ORDER — MIDAZOLAM HCL 5 MG/5ML IJ SOLN
INTRAMUSCULAR | Status: DC | PRN
  Administered 2018-09-06: 2 mg via INTRAVENOUS

## 2018-09-06 MED ORDER — INSULIN ASPART 100 UNIT/ML ~~LOC~~ SOLN
SUBCUTANEOUS | Status: AC
  Administered 2018-09-06: 4 [IU] via SUBCUTANEOUS
  Filled 2018-09-06: qty 1

## 2018-09-06 MED ORDER — SODIUM CHLORIDE 0.9 % IV SOLN
INTRAVENOUS | Status: DC
  Administered 2018-09-06: 09:00:00 via INTRAVENOUS
  Administered 2018-09-06: 100 mL/h via INTRAVENOUS

## 2018-09-06 MED ORDER — BUPIVACAINE HCL (PF) 0.5 % IJ SOLN
INTRAMUSCULAR | Status: DC | PRN
  Administered 2018-09-06: 2.6 mL

## 2018-09-06 MED ORDER — BUPIVACAINE HCL (PF) 0.5 % IJ SOLN
INTRAMUSCULAR | Status: AC
  Filled 2018-09-06: qty 10

## 2018-09-06 MED ORDER — TRANEXAMIC ACID-NACL 1000-0.7 MG/100ML-% IV SOLN
1000.0000 mg | Freq: Once | INTRAVENOUS | Status: AC
  Administered 2018-09-06: 1000 mg via INTRAVENOUS
  Filled 2018-09-06: qty 100

## 2018-09-06 MED ORDER — GLYCOPYRROLATE 0.2 MG/ML IJ SOLN
INTRAMUSCULAR | Status: AC
  Filled 2018-09-06: qty 1

## 2018-09-06 MED ORDER — PANTOPRAZOLE SODIUM 40 MG PO TBEC
40.0000 mg | DELAYED_RELEASE_TABLET | Freq: Two times a day (BID) | ORAL | Status: DC
  Administered 2018-09-06 – 2018-09-08 (×4): 40 mg via ORAL
  Filled 2018-09-06 (×4): qty 1

## 2018-09-06 MED ORDER — FLEET ENEMA 7-19 GM/118ML RE ENEM: 1.0000 | ENEMA | Freq: Once | RECTAL | Status: DC | PRN

## 2018-09-06 MED ORDER — CLINDAMYCIN PHOSPHATE 600 MG/50ML IV SOLN
600.0000 mg | Freq: Four times a day (QID) | INTRAVENOUS | Status: AC
  Administered 2018-09-06 – 2018-09-07 (×4): 600 mg via INTRAVENOUS
  Filled 2018-09-06 (×5): qty 50

## 2018-09-06 MED ORDER — BUPIVACAINE HCL (PF) 0.25 % IJ SOLN
INTRAMUSCULAR | Status: DC | PRN
  Administered 2018-09-06: 60 mL

## 2018-09-06 MED ORDER — INSULIN ASPART 100 UNIT/ML ~~LOC~~ SOLN
SUBCUTANEOUS | Status: DC | PRN
  Administered 2018-09-06 (×2): 5 [IU] via SUBCUTANEOUS

## 2018-09-06 MED ORDER — DIPHENHYDRAMINE HCL 12.5 MG/5ML PO ELIX: 12.5000 mg | ORAL_SOLUTION | ORAL | Status: DC | PRN

## 2018-09-06 MED ORDER — CELECOXIB 200 MG PO CAPS
200.0000 mg | ORAL_CAPSULE | Freq: Two times a day (BID) | ORAL | Status: DC
  Administered 2018-09-06 – 2018-09-08 (×4): 200 mg via ORAL
  Filled 2018-09-06 (×4): qty 1

## 2018-09-06 SURGICAL SUPPLY — 70 items
ATTUNE PS FEM RT SZ 3 CEM KNEE (Femur) ×3 IMPLANT
ATTUNE PSRP INSR SZ3 10 KNEE (Insert) ×2 IMPLANT
ATTUNE PSRP INSR SZ3 10MMKNEE (Insert) ×1 IMPLANT
BASEPLATE TIBIAL ROTATING SZ 4 (Knees) ×3 IMPLANT
BATTERY INSTRU NAVIGATION (MISCELLANEOUS) ×12 IMPLANT
BLADE SAW 70X12.5 (BLADE) ×3 IMPLANT
BLADE SAW 90X13X1.19 OSCILLAT (BLADE) ×3 IMPLANT
BLADE SAW 90X25X1.19 OSCILLAT (BLADE) ×3 IMPLANT
BONE CEMENT GENTAMICIN (Cement) ×6 IMPLANT
CANISTER SUCT 1200ML W/VALVE (MISCELLANEOUS) ×3 IMPLANT
CANISTER SUCT 3000ML PPV (MISCELLANEOUS) ×6 IMPLANT
CEMENT BONE GENTAMICIN 40 (Cement) ×2 IMPLANT
COOLER POLAR GLACIER W/PUMP (MISCELLANEOUS) ×3 IMPLANT
COVER WAND RF STERILE (DRAPES) ×3 IMPLANT
CUFF TOURN 24 STER (MISCELLANEOUS) ×3 IMPLANT
CUFF TOURN 30 STER DUAL PORT (MISCELLANEOUS) IMPLANT
DRAPE SHEET LG 3/4 BI-LAMINATE (DRAPES) ×3 IMPLANT
DRSG DERMACEA 8X12 NADH (GAUZE/BANDAGES/DRESSINGS) ×3 IMPLANT
DRSG OPSITE POSTOP 4X14 (GAUZE/BANDAGES/DRESSINGS) ×3 IMPLANT
DRSG TEGADERM 4X4.75 (GAUZE/BANDAGES/DRESSINGS) ×3 IMPLANT
DURAPREP 26ML APPLICATOR (WOUND CARE) ×6 IMPLANT
ELECT CAUTERY BLADE 6.4 (BLADE) ×3 IMPLANT
ELECT REM PT RETURN 9FT ADLT (ELECTROSURGICAL) ×3
ELECTRODE REM PT RTRN 9FT ADLT (ELECTROSURGICAL) ×1 IMPLANT
EX-PIN ORTHOLOCK NAV 4X150 (PIN) ×6 IMPLANT
GLOVE BIOGEL M STRL SZ7.5 (GLOVE) ×6 IMPLANT
GLOVE BIOGEL PI IND STRL 9 (GLOVE) ×1 IMPLANT
GLOVE BIOGEL PI INDICATOR 9 (GLOVE) ×2
GLOVE INDICATOR 8.0 STRL GRN (GLOVE) ×3 IMPLANT
GLOVE SURG SYN 9.0  PF PI (GLOVE) ×2
GLOVE SURG SYN 9.0 PF PI (GLOVE) ×1 IMPLANT
GOWN STRL REUS W/ TWL LRG LVL3 (GOWN DISPOSABLE) ×2 IMPLANT
GOWN STRL REUS W/TWL 2XL LVL3 (GOWN DISPOSABLE) ×3 IMPLANT
GOWN STRL REUS W/TWL LRG LVL3 (GOWN DISPOSABLE) ×4
HEMOVAC 400CC 10FR (MISCELLANEOUS) ×3 IMPLANT
HOLDER FOLEY CATH W/STRAP (MISCELLANEOUS) ×3 IMPLANT
HOOD PEEL AWAY FLYTE STAYCOOL (MISCELLANEOUS) ×6 IMPLANT
KIT TURNOVER KIT A (KITS) ×3 IMPLANT
KNIFE SCULPS 14X20 (INSTRUMENTS) ×3 IMPLANT
LABEL OR SOLS (LABEL) ×3 IMPLANT
NDL SAFETY ECLIPSE 18X1.5 (NEEDLE) ×1 IMPLANT
NEEDLE HYPO 18GX1.5 SHARP (NEEDLE) ×2
NEEDLE SPNL 20GX3.5 QUINCKE YW (NEEDLE) ×6 IMPLANT
NS IRRIG 500ML POUR BTL (IV SOLUTION) ×3 IMPLANT
PACK TOTAL KNEE (MISCELLANEOUS) ×3 IMPLANT
PAD WRAPON POLAR KNEE (MISCELLANEOUS) ×1 IMPLANT
PATELLA MEDIAL ATTUN 35MM KNEE (Knees) ×3 IMPLANT
PENCIL SMOKE ULTRAEVAC 22 CON (MISCELLANEOUS) ×3 IMPLANT
PIN DRILL QUICK PACK ×3 IMPLANT
PIN FIXATION 1/8DIA X 3INL (PIN) ×9 IMPLANT
PULSAVAC PLUS IRRIG FAN TIP (DISPOSABLE) ×3
SOL .9 NS 3000ML IRR  AL (IV SOLUTION) ×2
SOL .9 NS 3000ML IRR UROMATIC (IV SOLUTION) ×1 IMPLANT
SOL PREP PVP 2OZ (MISCELLANEOUS) ×3
SOLUTION PREP PVP 2OZ (MISCELLANEOUS) ×1 IMPLANT
SPONGE DRAIN TRACH 4X4 STRL 2S (GAUZE/BANDAGES/DRESSINGS) ×3 IMPLANT
STAPLER SKIN PROX 35W (STAPLE) ×3 IMPLANT
STRAP TIBIA SHORT (MISCELLANEOUS) ×3 IMPLANT
SUCTION FRAZIER HANDLE 10FR (MISCELLANEOUS) ×2
SUCTION TUBE FRAZIER 10FR DISP (MISCELLANEOUS) ×1 IMPLANT
SUT VIC AB 0 CT1 36 (SUTURE) ×3 IMPLANT
SUT VIC AB 1 CT1 36 (SUTURE) ×6 IMPLANT
SUT VIC AB 2-0 CT2 27 (SUTURE) ×3 IMPLANT
SYR 20CC LL (SYRINGE) ×3 IMPLANT
SYR 30ML LL (SYRINGE) ×6 IMPLANT
TIP FAN IRRIG PULSAVAC PLUS (DISPOSABLE) ×1 IMPLANT
TOWEL OR 17X26 4PK STRL BLUE (TOWEL DISPOSABLE) ×3 IMPLANT
TOWER CARTRIDGE SMART MIX (DISPOSABLE) ×3 IMPLANT
TRAY FOLEY MTR SLVR 16FR STAT (SET/KITS/TRAYS/PACK) ×3 IMPLANT
WRAPON POLAR PAD KNEE (MISCELLANEOUS) ×3

## 2018-09-06 NOTE — Anesthesia Post-op Follow-up Note (Signed)
Anesthesia QCDR form completed.        

## 2018-09-06 NOTE — H&P (Signed)
The patient has been re-examined, and the chart reviewed, and there have been no interval changes to the documented history and physical.    The risks, benefits, and alternatives have been discussed at length. The patient expressed understanding of the risks benefits and agreed with plans for surgical intervention.  James P. Hooten, Jr. M.D.    

## 2018-09-06 NOTE — OR Nursing (Signed)
Patient arrived very anxious for surgery. Discussed her insulin pump for surgery  Time.  Her sugar was up to 244 here, when it was 201 at home. She gave her self a small bolus from the pump and a recheck prior to going back to surgery was 272.  Patient is aware that the sugar would be checked during the surgery and insulin provided if needed.

## 2018-09-06 NOTE — Anesthesia Procedure Notes (Signed)
Spinal  Patient location during procedure: OR Start time: 09/06/2018 7:22 AM End time: 09/06/2018 7:40 AM Staffing Resident/CRNA: Bernardo Heater, CRNA Performed: resident/CRNA  Preanesthetic Checklist Completed: patient identified, site marked, surgical consent, pre-op evaluation, timeout performed, IV checked, risks and benefits discussed and monitors and equipment checked Spinal Block Patient position: sitting Prep: ChloraPrep Patient monitoring: heart rate, continuous pulse ox, blood pressure and cardiac monitor Approach: right paramedian Location: L3-4 Injection technique: single-shot Needle Needle type: Introducer and Pencan  Needle gauge: 24 G Needle length: 9 cm Additional Notes Negative paresthesia. Negative blood return. Positive free-flowing CSF. Expiration date of kit checked and confirmed. Patient tolerated procedure well, without complications.

## 2018-09-06 NOTE — Transfer of Care (Signed)
Immediate Anesthesia Transfer of Care Note  Patient: Cindy Sandoval  Procedure(s) Performed: COMPUTER ASSISTED TOTAL KNEE ARTHROPLASTY (Right )  Patient Location: PACU  Anesthesia Type:Spinal  Level of Consciousness: awake and alert   Airway & Oxygen Therapy: Patient Spontanous Breathing and Patient connected to nasal cannula oxygen  Post-op Assessment: Report given to RN and Post -op Vital signs reviewed and stable  Post vital signs: Reviewed and stable  Last Vitals:  Vitals Value Taken Time  BP    Temp    Pulse 87 09/06/2018 11:05 AM  Resp 4 09/06/2018 11:05 AM  SpO2 93 % 09/06/2018 11:05 AM  Vitals shown include unvalidated device data.  Last Pain:  Vitals:   09/06/18 0633  TempSrc: Tympanic  PainSc:       Patients Stated Pain Goal: 1 (85/46/27 0350)  Complications: No apparent anesthesia complications

## 2018-09-06 NOTE — Op Note (Signed)
OPERATIVE NOTE  DATE OF SURGERY:  09/06/2018  PATIENT NAME:  Cindy Sandoval   DOB: [birth year]  MRN: [id number]  PRE-OPERATIVE DIAGNOSIS: Degenerative arthrosis of the right knee, primary  POST-OPERATIVE DIAGNOSIS:  Same  PROCEDURE:  Right total knee arthroplasty using computer-assisted navigation  SURGEON:  Marciano Sequin. M.D.  ASSISTANT:  Liliane Bade, PA-C (present and scrubbed throughout the case, critical for assistance with exposure, retraction, instrumentation, and closure)  ANESTHESIA: spinal  ESTIMATED BLOOD LOSS: 50 mL  FLUIDS REPLACED: 1800 mL of crystalloid  TOURNIQUET TIME: 101 minutes  DRAINS: 2 medium Hemovac drains  SOFT TISSUE RELEASES: Anterior cruciate ligament, posterior cruciate ligament, deep medial collateral ligament, patellofemoral ligament  IMPLANTS UTILIZED: DePuy Attune size 3 posterior stabilized femoral component (cemented), size 4 rotating platform tibial component (cemented), 35 mm medialized dome patella (cemented), and a 10 mm stabilized rotating platform polyethylene insert.  INDICATIONS FOR SURGERY: Cindy Sandoval is a 68 y.o. year old female with a long history of progressive knee pain. X-rays demonstrated severe degenerative changes in tricompartmental fashion. The patient had not seen any significant improvement despite conservative nonsurgical intervention. After discussion of the risks and benefits of surgical intervention, the patient expressed understanding of the risks benefits and agree with plans for total knee arthroplasty.   The risks, benefits, and alternatives were discussed at length including but not limited to the risks of infection, bleeding, nerve injury, stiffness, blood clots, the need for revision surgery, cardiopulmonary complications, among others, and they were willing to proceed.  PROCEDURE IN DETAIL: The patient was brought into the operating room and, after adequate spinal anesthesia was achieved, a  tourniquet was placed on the patient's upper thigh. The patient's knee and leg were cleaned and prepped with alcohol and DuraPrep and draped in the usual sterile fashion. A "timeout" was performed as per usual protocol. The lower extremity was exsanguinated using an Esmarch, and the tourniquet was inflated to 300 mmHg. An anterior longitudinal incision was made followed by a standard mid vastus approach. The deep fibers of the medial collateral ligament were elevated in a subperiosteal fashion off of the medial flare of the tibia so as to maintain a continuous soft tissue sleeve. The patella was subluxed laterally and the patellofemoral ligament was incised. Inspection of the knee demonstrated severe degenerative changes with full-thickness loss of articular cartilage. Osteophytes were debrided using a rongeur. Anterior and posterior cruciate ligaments were excised. Two 4.0 mm Schanz pins were inserted in the femur and into the tibia for attachment of the array of trackers used for computer-assisted navigation. Hip center was identified using a circumduction technique. Distal landmarks were mapped using the computer. The distal femur and proximal tibia were mapped using the computer. The distal femoral cutting guide was positioned using computer-assisted navigation so as to achieve a 5 distal valgus cut. The femur was sized and it was felt that a size 3 femoral component was appropriate. A size 3 femoral cutting guide was positioned and the anterior cut was performed and verified using the computer. This was followed by completion of the posterior and chamfer cuts. Femoral cutting guide for the central box was then positioned in the center box cut was performed.  Attention was then directed to the proximal tibia. Medial and lateral menisci were excised. The extramedullary tibial cutting guide was positioned using computer-assisted navigation so as to achieve a 0 varus-valgus alignment and 3 posterior slope. The  cut was performed and verified using the computer. The proximal tibia  was sized and it was felt that a size 4 tibial tray was appropriate. Tibial and femoral trials were inserted followed by insertion of a 10 mm polyethylene insert. This allowed for excellent mediolateral soft tissue balancing both in flexion and in full extension. Finally, the patella was cut and prepared so as to accommodate a 35 mm medialized dome patella. A patella trial was placed and the knee was placed through a range of motion with excellent patellar tracking appreciated. The femoral trial was removed after debridement of posterior osteophytes. The central post-hole for the tibial component was reamed followed by insertion of a keel punch. Tibial trials were then removed. Cut surfaces of bone were irrigated with copious amounts of normal saline with antibiotic solution using pulsatile lavage and then suctioned dry. Polymethylmethacrylate cement with gentamicin was prepared in the usual fashion using a vacuum mixer. Cement was applied to the cut surface of the proximal tibia as well as along the undersurface of a size 4 rotating platform tibial component. Tibial component was positioned and impacted into place. Excess cement was removed using [employer] fast streamer. Cement was then applied to the cut surfaces of the femur as well as along the posterior flanges of the size 3 femoral component. The femoral component was positioned and impacted into place. Excess cement was removed using [employer] fast streamer. A 10 mm polyethylene trial was inserted and the knee was brought into full extension with steady axial compression applied. Finally, cement was applied to the backside of a 35 mm medialized dome patella and the patellar component was positioned and patellar clamp applied. Excess cement was removed using [employer] fast streamer. After adequate curing of the cement, the tourniquet was deflated after a total tourniquet time of 101 minutes. Hemostasis was achieved  using electrocautery. The knee was irrigated with copious amounts of normal saline with antibiotic solution using pulsatile lavage and then suctioned dry. 20 mL of 1.3% Exparel and 60 mL of 0.25% Marcaine in 40 mL of normal saline was injected along the posterior capsule, medial and lateral gutters, and along the arthrotomy site. A 10 mm stabilized rotating platform polyethylene insert was inserted and the knee was placed through a range of motion with excellent mediolateral soft tissue balancing appreciated and excellent patellar tracking noted. 2 medium drains were placed in the wound bed and brought out through separate stab incisions. The medial parapatellar portion of the incision was reapproximated using interrupted sutures of #1 Vicryl. Subcutaneous tissue was approximated in layers using first #0 Vicryl followed #2-0 Vicryl. The skin was approximated with skin staples. A sterile dressing was applied.  The patient tolerated the procedure well and was transported to the recovery room in stable condition.    James P. Holley Bouche., M.D.

## 2018-09-06 NOTE — Evaluation (Signed)
Physical Therapy Evaluation Patient Details Name: Cindy Sandoval MRN: [id number] DOB: 07-17-1951 Today's Date: 09/06/2018   History of Present Illness  Patient is a 68 year old female admitted for R TKA.   Clinical Impression  Patient reports she is feeling good, agrees to OOB activity. Reports no pain. Patient requires min assist with set up to perform activities, min assist with supine to sit and sit to stand. Min guard with ambulation with cues for WB and sequencing. Patient able to ambulate 15 feet, wbat with rw. Right knee flexion to 70 degrees. Patient will benefit from skilled PT to address her functional limitations and weakness for return home at discharge.       Follow Up Recommendations Home health PT    Equipment Recommendations  Rolling walker with 5" wheels    Recommendations for Other Services       Precautions / Restrictions Precautions Precautions: Fall Restrictions Weight Bearing Restrictions: Yes RLE Weight Bearing: Weight bearing as tolerated      Mobility  Bed Mobility Overal bed mobility: Modified Independent;Needs Assistance             General bed mobility comments: independent with supervision, use of rails  Transfers Overall transfer level: Needs assistance Equipment used: Rolling walker (2 wheeled) Transfers: Sit to/from Stand Sit to Stand: Modified independent (Device/Increase time);Min guard         General transfer comment: cues for hand placement, safety  Ambulation/Gait Ambulation/Gait assistance: Modified independent (Device/Increase time);Min guard Gait Distance (Feet): 15 Feet Assistive device: Rolling walker (2 wheeled) Gait Pattern/deviations: Step-to pattern;Decreased step length - right;Decreased step length - left Gait velocity: decreased   General Gait Details: Cues needed for sequencing, WB status  Stairs            Wheelchair Mobility    Modified Rankin (Stroke Patients Only)       Balance Overall  balance assessment: Modified Independent;Needs assistance Sitting-balance support: Single extremity supported;Feet supported Sitting balance-Leahy Scale: Good     Standing balance support: Bilateral upper extremity supported Standing balance-Leahy Scale: Good                               Pertinent Vitals/Pain Pain Assessment: No/denies pain    Home Living Family/patient expects to be discharged to:: Private residence Living Arrangements: Spouse/significant other Available Help at Discharge: Family Type of Home: House Home Access: Stairs to enter   [employer] of Steps: 2 Home Layout: Multi-level;Able to live on main level with bedroom/bathroom        Prior Function Level of Independence: Independent               Hand Dominance        Extremity/Trunk Assessment   Upper Extremity Assessment Upper Extremity Assessment: Overall WFL for tasks assessed    Lower Extremity Assessment Lower Extremity Assessment: Overall WFL for tasks assessed       Communication   Communication: No difficulties  Cognition Arousal/Alertness: Awake/alert Behavior During Therapy: WFL for tasks assessed/performed Overall Cognitive Status: Within Functional Limits for tasks assessed                                        General Comments      Exercises Total Joint Exercises Ankle Circles/Pumps: AROM;5 reps Goniometric ROM: 5 -70   Assessment/Plan  PT Assessment Patient needs continued PT services  PT Problem List Decreased range of motion;Decreased strength;Decreased mobility;Decreased safety awareness;Decreased knowledge of precautions;Decreased activity tolerance;Decreased balance;Decreased knowledge of use of DME       PT Treatment Interventions Gait training;Therapeutic activities;Stair training;Therapeutic exercise;Neuromuscular re-education;Balance training;Functional mobility training;DME instruction;Patient/family education     PT Goals (Current goals can be found in the Care Plan section)  Acute Rehab PT Goals Patient Stated Goal: to return home PT Goal Formulation: With patient/family Time For Goal Achievement: 09/20/18 Potential to Achieve Goals: Good    Frequency BID   Barriers to discharge        Co-evaluation               AM-PAC PT "6 Clicks" Mobility  Outcome Measure Help needed turning from your back to your side while in a flat bed without using bedrails?: A Little Help needed moving from lying on your back to sitting on the side of a flat bed without using bedrails?: A Little Help needed moving to and from a bed to a chair (including a wheelchair)?: A Little Help needed standing up from a chair using your arms (e.g., wheelchair or bedside chair)?: A Little Help needed to walk in hospital room?: A Little Help needed climbing 3-5 steps with a railing? : A Lot 6 Click Score: 17    End of Session Equipment Utilized During Treatment: Gait belt Activity Tolerance: Patient tolerated treatment well;No increased pain Patient left: with call bell/phone within reach;in chair;with chair alarm set;with family/visitor present;with SCD's reapplied Nurse Communication: Mobility status PT Visit Diagnosis: Muscle weakness (generalized) (M62.81);Difficulty in walking, not elsewhere classified (R26.2)    Time: 4193-7902 PT Time Calculation (min) (ACUTE ONLY): 25 min   Charges:   PT Evaluation $PT Eval Moderate Complexity: 1 Mod PT Treatments $Gait Training: 8-22 mins        Ezme Duch, PT, GCS 09/06/18,3:59 PM

## 2018-09-06 NOTE — Progress Notes (Addendum)
Inpatient Diabetes Program Recommendations  AACE/ADA: New Consensus Statement on Inpatient Glycemic Control  Target Ranges:  Prepandial:   less than 140 mg/dL      Peak postprandial:   less than 180 mg/dL (1-2 hours)      Critically ill patients:  140 - 180 mg/dL  Results for Cindy Sandoval, Cindy Sandoval (MRN [id number]) as of 09/06/2018 12:16  Ref. Range 09/06/2018 06:34 09/06/2018 07:09 09/06/2018 09:07 09/06/2018 10:18 09/06/2018 11:10  Glucose-Capillary Latest Ref Range: 70 - 99 mg/dL 244 (H) 272 (H) 327 (H) 297 (H) 261 (H)    Review of Glycemic Control  Diabetes history: DM1 (makes NO insulin; requires insulin for all needs - basal, correction, and for carbohydrates consumed) Outpatient Diabetes medications: Medtronic Insulin Pump with Novolog Current orders for Inpatient glycemic control: Insulin Pump AC&HS and 2am (Signed and Held)  Inpatient Diabetes Program Recommendations: NOTE: In reviewing the chart noted patient has Type 1 DM and uses a Medtronic insulin pump with Novolog for DM control. Patient is followed by Dr. Honor Junes and was last seen on 08/31/18. Per office note by Dr. Honor Junes on 08/31/18, the following should be patient's insulin pump settings:  Basal insulin  12A 0.7 units/hour 4A 0.7 units/hour 9A 0.7 units/hour 8P 0.7 units/hour Total daily basal insulin: 16.8 units/24 hours  Carb Coverage 1:10 1 unit for every 10 grams of carbohydrates  Insulin Sensitivity 12A 1:45 1 unit drops blood glucose 45 mg/dl 7A 1:34 1 unit drops blood glucose 34 mg/dl  Target Glucose Goals 12A 140 mg/dl 7A 100-120 mg/dl  Active Insulin time: 4 hours  Noted patient received Decadron 8 mg at 7:00 am today which is contributing to hyperglycemia. Will plan to follow up with patient once she goes to the floor.  NURSING: Once insulin pump order set is ordered please print off the Patient insulin pump contract and flow sheet. The insulin pump contract should be signed by the patient and then  placed in the chart. The patient insulin pump flow sheet will be completed by the patient at the bedside and the RN caring for the patient will use the patient's flow sheet to document in the [facility]. RN will need to complete the Nursing Insulin Pump Flowsheet at least once a shift. Patient will need to keep extra insulin pump supplies at the bedside at all times.   Addendum 09/06/18@13 :30-Talked with patient regarding DM control and insulin pump. Patient was in the process of trying to put on a new Dexcom CGM (Continuious Glucose Monitor) when Diabetes Coordinator got in the room. Patient had trouble with placing a new CGM sensor and will have to get her husband to bring a new sensor. Patient reports that she likes to keep the CGM on to provide more data on glycemic control. Explained that even if she is able to place a new CGM sensor, hospital staff will continue to check glucose with fingersticks using hospital glucometer. Reviewed insulin pump settings and verified all settings which are as noted above. Informed patient that she received Decadron 8 mg around 7:00 am today which could keep her glucose more elevated than usual for 24-48 hours. Patient verbalized understanding of information discussed and she notes that she has no questions or concerns at this time. Will continue to follow along while inpatient.   Thanks, Barnie Alderman, RN, MSN, CDE Diabetes Coordinator Inpatient Diabetes Program [phone] (Team Pager from 8am to 5pm)

## 2018-09-06 NOTE — Anesthesia Preprocedure Evaluation (Addendum)
Anesthesia Evaluation  Patient identified by MRN, date of birth, ID band Patient awake    Reviewed: Allergy & Precautions, H&P , NPO status , Patient's Chart, lab work & pertinent test results  History of Anesthesia Complications Negative for: history of anesthetic complications  Airway Mallampati: III  TM Distance: >3 FB Neck ROM: limited    Dental  (+) Poor Dentition, Chipped, Implants, Lower Dentures   Pulmonary neg shortness of breath, asthma ,    Pulmonary exam normal breath sounds clear to auscultation       Cardiovascular Exercise Tolerance: Good hypertension, Pt. on medications (-) angina(-) Past MI and (-) DOE Normal cardiovascular exam Rhythm:regular Rate:Normal     Neuro/Psych negative neurological ROS  negative psych ROS   GI/Hepatic Neg liver ROS, GERD  Controlled,  Endo/Other  diabetes, Type 2, Insulin Dependent  Renal/GU negative Renal ROS  negative genitourinary   Musculoskeletal  (+) Arthritis ,   Abdominal   Peds  Hematology negative hematology ROS (+)   Anesthesia Other Findings Past Medical History: No date: Arthritis     Comment: OSTEOARTHRITIS No date: Baker's cyst of knee No date: Cancer ([place])     Comment: basal cell No date: Diabetes mellitus without complication (HCC) No date: Diabetic arthropathy (HCC) No date: GERD (gastroesophageal reflux disease) No date: Hyperlipidemia No date: Hypertension No date: Microalbuminuria  Past Surgical History: No date: BREAST BIOPSY Left     Comment: neg No date: COLONOSCOPY No date: ESOPHAGOGASTRODUODENOSCOPY  BMI    Body Mass Index:  31.83 kg/m      Reproductive/Obstetrics negative OB ROS                             Anesthesia Physical  Anesthesia Plan  ASA: III  Anesthesia Plan: Spinal   Post-op Pain Management:    Induction: Intravenous  PONV Risk Score and Plan:   Airway Management Planned:  Nasal Cannula  Additional Equipment:   Intra-op Plan:   Post-operative Plan:   Informed Consent: I have reviewed the patients History and Physical, chart, labs and discussed the procedure including the risks, benefits and alternatives for the proposed anesthesia with the patient or authorized representative who has indicated his/her understanding and acceptance.   Dental Advisory Given  Plan Discussed with: Anesthesiologist, CRNA and Surgeon  Anesthesia Plan Comments:         Anesthesia Quick Evaluation

## 2018-09-07 LAB — GLUCOSE, CAPILLARY
Glucose-Capillary: 175 mg/dL — ABNORMAL HIGH (ref 70–99)
Glucose-Capillary: 178 mg/dL — ABNORMAL HIGH (ref 70–99)
Glucose-Capillary: 252 mg/dL — ABNORMAL HIGH (ref 70–99)
Glucose-Capillary: 155 mg/dL — ABNORMAL HIGH (ref 70–99)
Glucose-Capillary: 199 mg/dL — ABNORMAL HIGH (ref 70–99)

## 2018-09-07 MED ORDER — TRAMADOL HCL 50 MG PO TABS: 50.0000 mg | ORAL_TABLET | ORAL | 0 refills | Status: DC | PRN

## 2018-09-07 MED ORDER — ENOXAPARIN SODIUM 40 MG/0.4ML ~~LOC~~ SOLN: 40.0000 mg | SUBCUTANEOUS | 0 refills | Status: DC

## 2018-09-07 MED ORDER — OXYCODONE HCL 5 MG PO TABS: 5.0000 mg | ORAL_TABLET | ORAL | 0 refills | Status: DC | PRN

## 2018-09-07 NOTE — Progress Notes (Signed)
ORTHOPAEDICS PROGRESS NOTE  PATIENT NAME: Cindy Sandoval DOB: 03-10-1951  MRN: [id number]  POD # 1: Right total knee arthroplasty  Subjective: The patient did well last night.  Pain is been under good control.  No nausea or vomiting. The patient did well with physical therapy yesterday.  Objective: Vital signs in last 24 hours: Temp:  [97.5 F (36.4 C)-98.9 F (37.2 C)] 97.5 F (36.4 C) (01/14 0313) Pulse Rate:  [68-93] 75 (01/14 0313) Resp:  [15-28] 19 (01/14 0313) BP: (90-147)/(48-72) 139/71 (01/14 0313) SpO2:  [92 %-100 %] 100 % (01/14 0313)  Intake/Output from previous day: 01/13 0701 - 01/14 0700 In: 2250 [I.V.:2100; IV Piggyback:150] Out: 3240 [Urine:2950; Drains:240; Blood:50]  No results for input(s): WBC, HGB, HCT, PLT, K, CL, CO2, BUN, CREATININE, GLUCOSE, CALCIUM, LABPT, INR in the last 72 hours.  EXAM General: Well-developed well-nourished female seen in no apparent discomfort. Lungs: clear to auscultation Cardiac: normal rate and regular rhythm Abdomen: Soft, nontender, nondistended.  Bowel sounds are present. Right lower extremity: Dressing is dry and intact.  Polar Care and Hemovac drains are in place and functioning.  Bone foam is in place.  The patient is able to perform an independent straight leg raise.  Homans test is negative. Neurologic: Awake, alert, and oriented.  Sensory and motor function are intact.  Assessment: Right total knee arthroplasty  Secondary diagnoses: Insulin-dependent diabetes Hypertension Hyperlipidemia Glaucoma Gastroesophageal reflux disease Asthma due to seasonal allergies  Plan: Today's goals were reviewed with the patient.  Continue with physical therapy and Occupational Therapy as per total knee arthroplasty rehab protocol. Appreciate Diabetes Nurse assisting with glucose control/insulin pump. Plan is to go Home after hospital stay. DVT Prophylaxis - Lovenox, Foot Pumps and TED hose  James P. Holley Bouche M.D.

## 2018-09-07 NOTE — Progress Notes (Signed)
Physical Therapy Treatment Patient Details Name: Cindy Sandoval MRN: [id number] DOB: [birth year] Today's Date: 09/07/2018    History of Present Illness Patient is a 69 year old female admitted for R TKA 09/06/18.  PMH includes IDDM (insulin pump), htn, glaucoma, GERD, asthma.     PT Comments    Pt able to ambulate 120 feet (x2) with RW SBA and navigate stairs safely with CGA.  Pain 0/10 R knee beginning of session and 3/10 end of session (nurse notified).  Will continue to progress pt with strengthening, knee ROM, and progressive ambulation per pt tolerance.    Follow Up Recommendations  Home health PT     Equipment Recommendations  Rolling walker with 5" wheels(youth sized)    Recommendations for Other Services       Precautions / Restrictions Precautions Precautions: Fall Required Braces or Orthoses: Knee Immobilizer - Right Knee Immobilizer - Right: Discontinue once straight leg raise with < 10 degree lag Restrictions Weight Bearing Restrictions: Yes RLE Weight Bearing: Weight bearing as tolerated    Mobility  Bed Mobility               General bed mobility comments: Deferred (pt up in chair beginning/end of session)  Transfers Overall transfer level: Needs assistance Equipment used: Rolling walker (2 wheeled) Transfers: Sit to/from [facility] Sit to Stand: Supervision Stand pivot transfers: Supervision       General transfer comment: no physical assist required; no vc's for technique; steady  Ambulation/Gait Ambulation/Gait assistance: Supervision Gait Distance (Feet): (120 feet x2) Assistive device: Rolling walker (2 wheeled)   Gait velocity: mildly decreased   General Gait Details: step through gait pattern; good R LE heelstrike; mild decreased stance time R LE   Stairs Stairs: Yes Stairs assistance: Min guard Stair Management: Two rails;One rail Right;Step to pattern;Forwards;Sideways Number of Stairs: (4 steps x2  trials) General stair comments: ascended/descend 4 steps with B railings (forwards) and ascended/descended 4 steps with R railing (sidestepping) with CGA; initial vc's and demo required and then pt able to perform safely on own   Wheelchair Mobility    Modified Rankin (Stroke Patients Only)       Balance Overall balance assessment: Needs assistance Sitting-balance support: No upper extremity supported;Feet supported Sitting balance-Leahy Scale: Normal Sitting balance - Comments: steady sitting reaching outside BOS   Standing balance support: No upper extremity supported Standing balance-Leahy Scale: Good Standing balance comment: steady standing reaching within BOS                            Cognition Arousal/Alertness: Awake/alert Behavior During Therapy: WFL for tasks assessed/performed Overall Cognitive Status: Within Functional Limits for tasks assessed                                        Exercises Total Joint Exercises Goniometric ROM (taken in AM session): R knee extension 2 degrees short of neutral semi-supine in chair; R knee flexion 80 degrees AROM sitting edge of recliner    General Comments General comments (skin integrity, edema, etc.): R knee hemovac and dressings in place.  Pt agreeable to PT session.      Pertinent Vitals/Pain Pain Assessment: 0-10 Pain Score: 3  Pain Location: R knee at end of session after mobility Pain Descriptors / Indicators: Sore Pain Intervention(s): Limited activity within patient's tolerance;Monitored during session;Repositioned;Other (comment)(polar  care applied and activated)  Vitals (HR and O2 on room air) stable and WFL throughout treatment session.    Home Living                      Prior Function            PT Goals (current goals can now be found in the care plan section) Acute Rehab PT Goals Patient Stated Goal: to return home and get back to playing with my great grandson PT  Goal Formulation: With patient Time For Goal Achievement: 09/20/18 Potential to Achieve Goals: Good Progress towards PT goals: Progressing toward goals    Frequency    BID      PT Plan Current plan remains appropriate    Co-evaluation              AM-PAC PT "6 Clicks" Mobility   Outcome Measure  Help needed turning from your back to your side while in a flat bed without using bedrails?: None Help needed moving from lying on your back to sitting on the side of a flat bed without using bedrails?: None Help needed moving to and from a bed to a chair (including a wheelchair)?: A Little Help needed standing up from a chair using your arms (e.g., wheelchair or bedside chair)?: A Little Help needed to walk in hospital room?: A Little Help needed climbing 3-5 steps with a railing? : A Little 6 Click Score: 20    End of Session Equipment Utilized During Treatment: Gait belt Activity Tolerance: Patient tolerated treatment well Patient left: in chair;with call bell/phone within reach;with chair alarm set;with family/visitor present;with SCD's reapplied;Other (comment)(B heels elevated via towel rolls; polar care in place and activated) Nurse Communication: Mobility status;Precautions;Weight bearing status PT Visit Diagnosis: Muscle weakness (generalized) (M62.81);Difficulty in walking, not elsewhere classified (R26.2);Pain Pain - Right/Left: Right Pain - part of body: Knee     Time: 1335-1406 PT Time Calculation (min) (ACUTE ONLY): 31 min  Charges:  $Gait Training: 8-22 mins $Therapeutic Activity: 8-22 mins                    Leitha Bleak, PT 09/07/18, 2:17 PM [phone]

## 2018-09-07 NOTE — Progress Notes (Signed)
Physical Therapy Treatment Patient Details Name: Cindy Sandoval MRN: [id number] DOB: [birth year] Today's Date: 09/07/2018    History of Present Illness Patient is a 68 year old female admitted for R TKA 09/06/18.  PMH includes IDDM (insulin pump), htn, glaucoma, GERD, asthma.     PT Comments    Pt able to progress to ambulating 130 feet with RW CGA.  Pain 2/10 R knee beginning of session and 1/10 end of session.  R knee flexion AROM to 80 degrees today.  Will continue to progress pt with strengthening, knee ROM, and progressive ambulation per pt tolerance.    Follow Up Recommendations  Home health PT     Equipment Recommendations  Rolling walker with 5" wheels    Recommendations for Other Services       Precautions / Restrictions Precautions Precautions: Fall Required Braces or Orthoses: Knee Immobilizer - Right Knee Immobilizer - Right: Discontinue once straight leg raise with < 10 degree lag Restrictions Weight Bearing Restrictions: Yes RLE Weight Bearing: Weight bearing as tolerated    Mobility  Bed Mobility Overal bed mobility: Modified Independent             General bed mobility comments: Deferred (pt up in chair beginning/end of session)  Transfers Overall transfer level: Needs assistance Equipment used: Rolling walker (2 wheeled) Transfers: Sit to/from Stand Sit to Stand: Supervision         General transfer comment: no physical assist required; minimal vc's for technique; steady  Ambulation/Gait Ambulation/Gait assistance: Min guard Gait Distance (Feet): 130 Feet Assistive device: Rolling walker (2 wheeled)   Gait velocity: decreased   General Gait Details: partial step through gait pattern; good R LE heelstrike; mild decreased stance time R LE   Stairs             Wheelchair Mobility    Modified Rankin (Stroke Patients Only)       Balance Overall balance assessment: Needs assistance Sitting-balance support: No upper  extremity supported;Feet supported Sitting balance-Leahy Scale: Normal Sitting balance - Comments: steady sitting reaching outside BOS   Standing balance support: No upper extremity supported Standing balance-Leahy Scale: Good Standing balance comment: steady static standing no UE support                            Cognition Arousal/Alertness: Awake/alert Behavior During Therapy: WFL for tasks assessed/performed Overall Cognitive Status: Within Functional Limits for tasks assessed                                        Exercises Total Joint Exercises Long Arc Quad: AROM;Strengthening;Right;10 reps;Seated Knee Flexion: AROM;Strengthening;Right;10 reps;Seated Goniometric ROM: R knee extension 2 degrees short of neutral semi-supine in chair; R knee flexion 80 degrees AROM sitting edge of recliner Marching: AROM;Strengthening;Both;10 reps;Seated    General Comments General comments (skin integrity, edema, etc.): R knee hemovac and dressings in place.  Pt agreeable to PT session.      Pertinent Vitals/Pain Pain Assessment: 0-10 Pain Score: 1  Pain Location: R knee at end of session after mobility Pain Descriptors / Indicators: Sore Pain Intervention(s): Limited activity within patient's tolerance;Monitored during session;Premedicated before session;Repositioned;Other (comment)(polar care applied and activated)    Home Living         Prior Function           PT Goals (current goals can  now be found in the care plan section) Acute Rehab PT Goals Patient Stated Goal: to return home and get back to playing with my great grandson PT Goal Formulation: With patient Time For Goal Achievement: 09/20/18 Potential to Achieve Goals: Good Progress towards PT goals: Progressing toward goals    Frequency    BID      PT Plan Current plan remains appropriate    Co-evaluation              AM-PAC PT "6 Clicks" Mobility   Outcome Measure  Help  needed turning from your back to your side while in a flat bed without using bedrails?: A Little Help needed moving from lying on your back to sitting on the side of a flat bed without using bedrails?: A Little   Help needed standing up from a chair using your arms (e.g., wheelchair or bedside chair)?: A Little Help needed to walk in hospital room?: A Little Help needed climbing 3-5 steps with a railing? : A Little 6 Click Score: 15    End of Session Equipment Utilized During Treatment: Gait belt Activity Tolerance: Patient tolerated treatment well Patient left: in chair;with call bell/phone within reach;with nursing/sitter in room;Other (comment)(NT present to assist pt with finishing washing up end of session (NT reported she would set pt up in chair when she was finished including towel rolls and chair alarm)) Nurse Communication: Mobility status;Precautions;Weight bearing status PT Visit Diagnosis: Muscle weakness (generalized) (M62.81);Difficulty in walking, not elsewhere classified (R26.2);Pain Pain - Right/Left: Right Pain - part of body: Knee     Time: 1025-1050 PT Time Calculation (min) (ACUTE ONLY): 25 min  Charges:  $Gait Training: 8-22 mins $Therapeutic Exercise: 8-22 mins                    Leitha Bleak, PT 09/07/18, 11:04 AM [phone]

## 2018-09-07 NOTE — Care Management Note (Signed)
Case Management Note  Patient Details  Name: Cindy Sandoval MRN: [id number] Date of Birth: 1950-11-20  Subjective/Objective:                   Met with patient to discuss discharge plan She states that her doctor plans on her having [facility] PT  Patient would like [facility], notified Corene Cornea with [facility] that patient would like to use [facility] for Home PT. She does need a RW and a BCC, notified Corene Cornea with [facility] of the need for the DME Let the patient know that I will check on the price of Lovenox before she goes home if it is ordered. Patient uses [facility] in [place] as a pharmacy Patient is able to afford medications with her current insurance Patient uses Dr. Mercy Riding as PCP Patient has transportation  Action/Plan: [facility] list provided per CMS.gov   Expected Discharge Date:                  Expected Discharge Plan:     In-House Referral:     Discharge planning Services  CM Consult  Post Acute Care Choice:  Durable Medical Equipment Choice offered to:  Patient  DME Arranged:  3-N-1, Walker rolling DME Agency:  [place]:    [place]:     Status of Service:  Completed, signed off  If discussed at [place] of Stay Meetings, dates discussed:    Additional Comments:  Su Hilt, RN 09/07/2018, 11:07 AM

## 2018-09-07 NOTE — Progress Notes (Signed)
Clinical Social Worker (CSW) received SNF consult. PT is recommending home health. RN case manager aware of above. Please reconsult if future social work needs arise. CSW signing off.   Kaidyn Hernandes, LCSW [phone] 

## 2018-09-07 NOTE — Anesthesia Postprocedure Evaluation (Signed)
Anesthesia Post Note  Patient: Cindy Sandoval  Procedure(s) Performed: COMPUTER ASSISTED TOTAL KNEE ARTHROPLASTY (Right )  Patient location during evaluation: Nursing Unit Anesthesia Type: Spinal Level of consciousness: oriented and awake and alert Pain management: pain level controlled Vital Signs Assessment: post-procedure vital signs reviewed and stable Respiratory status: spontaneous breathing, respiratory function stable and patient connected to nasal cannula oxygen Cardiovascular status: blood pressure returned to baseline and stable Postop Assessment: no headache, no backache and no apparent nausea or vomiting Anesthetic complications: no     Last Vitals:  Vitals:   09/07/18 0313 09/07/18 0750  BP: 139/71 (!) 136/57  Pulse: 75 71  Resp: 19 14  Temp: (!) 36.4 C 36.7 C  SpO2: 100% 99%    Last Pain:  Vitals:   09/07/18 0815  TempSrc:   PainSc: 7                  Quavion Boule,  Clearnce Sorrel

## 2018-09-07 NOTE — Discharge Summary (Signed)
Physician Discharge Summary  Patient ID: Cindy Sandoval MRN: [id number] DOB/AGE: 1950-11-18 68 y.o.  Admit date: 09/06/2018 Discharge date: 09/08/2018  Admission Diagnoses:  PRIMARY OSTEOARTHRITIS OF RIGHT KNEE   Discharge Diagnoses: Patient Active Problem List   Diagnosis Date Noted  . Hyperlipidemia due to type 1 diabetes mellitus ([place]) 09/06/2018  . Type 1 diabetes mellitus with microalbuminuria ([place]) 09/06/2018  . Total knee replacement status 09/06/2018  . Hypertensive retinopathy, grade 2, bilateral 06/17/2018  . 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 Hypoglycemia associated with diabetes ([place]) 04/27/2014  . Insulin pump titration 04/27/2014  . Microalbuminuria 04/27/2014  . GERD (gastroesophageal reflux disease) 06/07/2013  . Hypertension associated with diabetes ([place]) 06/07/2013    Past Medical History:  Diagnosis Date  . Arthritis    OSTEOARTHRITIS  . Asthma due to seasonal allergies    SEVERE sinus drainage. difficulty breathing if laying flat  . Baker's cyst of knee   . Cancer ([place])    basal cell  . Dental root implant present   . Diabetes mellitus without complication (HCC)    TYPE I, USES INSULIN PUMP  . Diabetic arthropathy ([place])   . GERD (gastroesophageal reflux disease)   . Glaucoma 2019  . Hyperlipidemia   . Hypertension   . Insulin pump in place 08/2018  . Microalbuminuria   . Shingles 2014     Transfusion: No transfusions during this admission   Consultants (if any):   Discharged Condition: Improved  Hospital Course: Cindy Sandoval is an 68 y.o. female who was admitted 09/06/2018 with a diagnosis of degenerative arthrosis right knee and went to the operating room on 09/06/2018 and underwent the above named procedures.    Surgeries:Procedure(s): COMPUTER ASSISTED TOTAL KNEE ARTHROPLASTY on 09/06/2018  PRE-OPERATIVE DIAGNOSIS: Degenerative arthrosis of the right  knee, primary  POST-OPERATIVE DIAGNOSIS:  Same  PROCEDURE:  Right total knee arthroplasty using computer-assisted navigation  SURGEON:  Marciano Sequin. M.D.  ASSISTANT:  Liliane Bade, PA-C (present and scrubbed throughout the case, critical for assistance with exposure, retraction, instrumentation, and closure)  ANESTHESIA: spinal  ESTIMATED BLOOD LOSS: 50 mL  FLUIDS REPLACED: 1800 mL of crystalloid  TOURNIQUET TIME: 101 minutes  DRAINS: 2 medium Hemovac drains  SOFT TISSUE RELEASES: Anterior cruciate ligament, posterior cruciate ligament, deep medial collateral ligament, patellofemoral ligament  IMPLANTS UTILIZED: DePuy Attune size 3 posterior stabilized femoral component (cemented), size 4 rotating platform tibial component (cemented), 35 mm medialized dome patella (cemented), and a 10 mm stabilized rotating platform polyethylene insert.  INDICATIONS FOR SURGERY: Cindy Sandoval is a 68 y.o. year old female with a long history of progressive knee pain. X-rays demonstrated severe degenerative changes in tricompartmental fashion. The patient had not seen any significant improvement despite conservative nonsurgical intervention. After discussion of the risks and benefits of surgical intervention, the patient expressed understanding of the risks benefits and agree with plans for total knee arthroplasty.   The risks, benefits, and alternatives were discussed at length including but not limited to the risks of infection, bleeding, nerve injury, stiffness, blood clots, the need for revision surgery, cardiopulmonary complications, among others, and they were willing to proceed. Patient tolerated the surgery well. No complications .Patient was taken to PACU where she was stabilized and then transferred to the orthopedic floor.  Patient started on Lovenox 30 mg q 12 hrs. Foot pumps applied bilaterally at 80 mm hgb. Heels elevated off bed with rolled towels. No  evidence of  DVT. Calves non tender. Negative Homan. Physical therapy started on day #1 for gait training and transfer with OT starting on  day #1 for ADL and assisted devices. Patient has done well with therapy. Ambulated greater than 200 feet upon being discharged.  Was able to ascend and descend 4 steps safely and independently  Patient's IV And Foley were discontinued on day #1 with Hemovac being discontinued on day #2. Dressing was changed on day 2 prior to patient being discharged   She was given perioperative antibiotics:  Anti-infectives (From admission, onward)   Start     Dose/Rate Route Frequency Ordered Stop   09/06/18 1400  clindamycin (CLEOCIN) IVPB 600 mg     600 mg 100 mL/hr over 30 Minutes Intravenous Every 6 hours 09/06/18 1243 09/07/18 1359   09/06/18 0920  gentamicin (GARAMYCIN) 80 mg in sodium chloride 0.9 % 500 mL irrigation  Status:  Discontinued       As needed 09/06/18 0920 09/06/18 1101   09/06/18 0608  clindamycin (CLEOCIN) 900 MG/50ML IVPB    Note to Pharmacy:  Cleatis Polka   : cabinet override      09/06/18 0608 09/06/18 0750   09/06/18 0600  clindamycin (CLEOCIN) IVPB 900 mg     900 mg 100 mL/hr over 30 Minutes Intravenous On call to O.R. 09/06/18 0522 09/06/18 0802    .  She was fitted with AV 1 compression foot pump devices, instructed on heel pumps, early ambulation, and fitted with TED stockings bilaterally for DVT prophylaxis.  She benefited maximally from the hospital stay and there were no complications.    Recent vital signs:  Vitals:   09/06/18 2340 09/07/18 0313  BP: 120/61 139/71  Pulse: 68 75  Resp: 18 19  Temp: 97.7 F (36.5 C) (!) 97.5 F (36.4 C)  SpO2: 97% 100%    Recent laboratory studies:  Lab Results  Component Value Date   HGB 14.1 09/01/2018   Lab Results  Component Value Date   WBC 7.8 09/01/2018   PLT 298 09/01/2018   Lab Results  Component Value Date   INR 0.94 09/01/2018   Lab Results  Component Value Date   NA 130 (L)  09/01/2018   K 4.9 09/01/2018   CL 96 (L) 09/01/2018   CO2 25 09/01/2018   BUN 12 09/01/2018   CREATININE 0.64 09/01/2018   GLUCOSE 163 (H) 09/01/2018    Discharge Medications:   Allergies as of 09/07/2018      Reactions   Kiwi Extract Anaphylaxis   Septra [sulfamethoxazole-trimethoprim] Nausea And Vomiting   Required patient to be hospitalized after taking this   Adhesive [tape] Rash   tegaderm is okay. Broke out in rash around pump with plastic tape. USE TAPE SPARINGLY   Bactroban [mupirocin Calcium] Swelling   Used around knee and caused swelling   Insulin Glargine Hives   lantus caused problem   Lisinopril Other (See Comments)   BODY ACHED EVERYWHERE. NO ENERGY   Simvastatin Other (See Comments)   BODY ACHED EVERYWHERE   Cephalexin Diarrhea   Dexilant [dexlansoprazole] Other (See Comments)   Did not help digestive issues      Medication List    STOP taking these medications   aspirin EC 81 MG tablet   naproxen 500 MG tablet Commonly known as:  NAPROSYN     TAKE these medications   amLODipine 10 MG tablet Commonly known as:  NORVASC Take 10 mg by mouth daily.  cetirizine 10 MG tablet Commonly known as:  ZYRTEC Take 10 mg by mouth daily.   diclofenac sodium 1 % Gel Commonly known as:  VOLTAREN Apply 1 application topically 3 (three) times daily as needed (pain).   enoxaparin 40 MG/0.4ML injection Commonly known as:  LOVENOX Inject 0.4 mLs (40 mg total) into the skin daily for 14 days. Start taking on:  September 09, 2018   insulin aspart 100 UNIT/ML injection Commonly known as:  novoLOG Inject into the skin. Pt uses in INSULIN PUMP   insulin detemir 100 UNIT/ML injection Commonly known as:  LEVEMIR Inject into the skin. Uses as a backup if PUMP gives out   insulin pump Soln Inject into the skin.   losartan 100 MG tablet Commonly known as:  COZAAR Take 100 mg by mouth daily.   Melatonin 10 MG/ML Liqd Take 5 mg by mouth at bedtime as needed.    omeprazole 40 MG capsule Commonly known as:  PRILOSEC Take 40 mg by mouth daily.   oxyCODONE 5 MG immediate release tablet Commonly known as:  Oxy IR/ROXICODONE Take 1 tablet (5 mg total) by mouth every 4 (four) hours as needed for moderate pain (pain score 4-6).   pravastatin 20 MG tablet Commonly known as:  PRAVACHOL Take 20 mg by mouth at bedtime.   traMADol 50 MG tablet Commonly known as:  ULTRAM Take 1-2 tablets (50-100 mg total) by mouth every 4 (four) hours as needed for moderate pain.            Durable Medical Equipment  (From admission, onward)         Start     Ordered   09/06/18 1244  DME Walker rolling  Once    Question:  Patient needs a walker to treat with the following condition  Answer:  Total knee replacement status   09/06/18 1243   09/06/18 1244  DME Bedside commode  Once    Question:  Patient needs a bedside commode to treat with the following condition  Answer:  Total knee replacement status   09/06/18 1243          Diagnostic Studies: Dg Knee Right Port  Result Date: 09/06/2018 CLINICAL DATA:  Status post right knee replacement EXAM: PORTABLE RIGHT KNEE - 1-2 VIEW COMPARISON:  None. FINDINGS: Right knee replacement is noted in satisfactory position. Surgical drains are noted in place. No soft tissue abnormality is noted. IMPRESSION: Status post right knee replacement. Electronically Signed   By: Inez Catalina M.D.   On: 09/06/2018 11:39    Disposition:   Discharge Instructions    Increase activity slowly   Complete by:  As directed       Follow-up Information    Watt Climes, PA On 09/21/2018.   Specialty:  Physician Assistant Why:  at 8:45am Contact information: [place] [address] [phone]        Dereck Leep, MD On 10/19/2018.   Specialty:  Orthopedic Surgery Why:  at 9:15am Contact information: [place] [address] [phone]             Signed: Watt Climes 09/07/2018, 7:45 AM

## 2018-09-07 NOTE — Evaluation (Signed)
Occupational Therapy Evaluation Patient Details Name: Cindy Sandoval MRN: [id number] DOB: [birth year] Today's Date: 09/07/2018    History of Present Illness Patient is a 68 year old female admitted for R TKA.    Clinical Impression   Pt seen for OT evaluation this date, POD#1 from above surgery. Pt was independent in all ADL and mobility prior to surgery, however experiencing increasing pain in R knee. Pt is eager to return to PLOF with less pain and improved safety and independence. Pt currently requires PRN minimal assist for LB dressing while in seated position due to pain and limited AROM of R knee and max assist for compression stockings mgt - pt reports spouse and daughter will be able to provide this level of assist upon return home. Pt instructed in polar care mgt, falls prevention strategies, home/routines modifications, DME/AE for LB bathing and dressing tasks, and compression stocking mgt. Pt verbalized understanding of all education/training provided. No additional skilled OT needs indicated. Do not currently anticipate any OT needs following this hospitalization. Will sign off. Please re-consult if additional needs arise.     Follow Up Recommendations  No OT follow up    Equipment Recommendations  3 in 1 bedside commode;Other (comment)(reacher)    Recommendations for Other Services       Precautions / Restrictions Precautions Precautions: Fall Restrictions Weight Bearing Restrictions: Yes RLE Weight Bearing: Weight bearing as tolerated      Mobility Bed Mobility Overal bed mobility: Modified Independent                Transfers Overall transfer level: Needs assistance Equipment used: Rolling walker (2 wheeled) Transfers: Sit to/from Stand Sit to Stand: Supervision         General transfer comment: cues for hand placement with pt able to perform without physical assist, good control, no LOB    Balance Overall balance assessment: Modified  Independent Sitting-balance support: Feet supported Sitting balance-Leahy Scale: Good     Standing balance support: Bilateral upper extremity supported Standing balance-Leahy Scale: Good                             ADL either performed or assessed with clinical judgement   ADL Overall ADL's : Needs assistance/impaired                                       General ADL Comments: PRN MIN A for LB ADL, MAX A for compression stockings, family able to provide needed level of assist; supervision for grooming at sink and all aspects of toileting     Vision Baseline Vision/History: Wears glasses Wears Glasses: Reading only Patient Visual Report: No change from baseline       Perception     Praxis      Pertinent Vitals/Pain Pain Assessment: 0-10 Pain Score: 2  Pain Location: R knee at end of session after mobility Pain Descriptors / Indicators: Aching Pain Intervention(s): Limited activity within patient's tolerance;Monitored during session;Repositioned;Ice applied;Premedicated before session     Hand Dominance Right   Extremity/Trunk Assessment Upper Extremity Assessment Upper Extremity Assessment: Overall WFL for tasks assessed   Lower Extremity Assessment Lower Extremity Assessment: Overall WFL for tasks assessed(expected post-op strength/ROM deficits)   Cervical / Trunk Assessment Cervical / Trunk Assessment: Normal   Communication Communication Communication: No difficulties   Cognition Arousal/Alertness: Awake/alert Behavior During Therapy:  WFL for tasks assessed/performed Overall Cognitive Status: Within Functional Limits for tasks assessed                                     General Comments       Exercises Other Exercises Other Exercises: pt instructed in polar care mgt, compression stocking mgt, AE/DME (with pictures provided of reacher taken on pt's phone per her request), and falls prevention strategies - pt  verbalized understanding of all education/training provided.   Shoulder Instructions      Home Living Family/patient expects to be discharged to:: Private residence Living Arrangements: Spouse/significant other Available Help at Discharge: Family;Available 24 hours/day(spouse and daughter can help) Type of Home: House Home Access: Stairs to enter [employer] of Steps: 2   Home Layout: Multi-level;Able to live on main level with bedroom/bathroom     Bathroom Shower/Tub: Occupational psychologist: Standard     Home Equipment: None          Prior Functioning/Environment Level of Independence: Independent                 OT Problem List: Decreased knowledge of use of DME or AE;Pain;Decreased range of motion;Decreased strength      OT Treatment/Interventions:      OT Goals(Current goals can be found in the care plan section) Acute Rehab OT Goals Patient Stated Goal: to return home and get back to playing with my great grandson OT Goal Formulation: All assessment and education complete, DC therapy  OT Frequency:     Barriers to D/C:            Co-evaluation              AM-PAC OT "6 Clicks" Daily Activity     Outcome Measure Help from another person eating meals?: None Help from another person taking care of personal grooming?: None Help from another person toileting, which includes using toliet, bedpan, or urinal?: None Help from another person bathing (including washing, rinsing, drying)?: A Little Help from another person to put on and taking off regular upper body clothing?: None Help from another person to put on and taking off regular lower body clothing?: A Little 6 Click Score: 22   End of Session Equipment Utilized During Treatment: Gait belt;Rolling walker  Activity Tolerance: Patient tolerated treatment well Patient left: in chair;with call bell/phone within reach;with chair alarm set;Other (comment)(rolled towel under R  ankle, polar care in place)  OT Visit Diagnosis: Other abnormalities of gait and mobility (R26.89);Pain Pain - Right/Left: Right Pain - part of body: Knee                Time: 0828-0905 OT Time Calculation (min): 37 min Charges:  OT General Charges $OT Visit: 1 Visit OT Evaluation $OT Eval Low Complexity: 1 Low OT Treatments $Self Care/Home Management : 23-37 mins  Jeni Salles, MPH, MS, OTR/L ascom [phone] 09/07/18, 9:16 AM

## 2018-09-07 NOTE — NC FL2 (Signed)
[place] LEVEL OF CARE SCREENING TOOL     IDENTIFICATION  Patient Name: Cindy Sandoval Birthdate: July 02, 1951 Sex: female Admission Date (Current Location): 09/06/2018  [place] and [place] Number:  Engineering geologist and Address:  [facility], [address] [place], [place] [address]      Provider Number: [id number]  Attending Physician Name and Address:  Dereck Leep, MD  Relative Name and Phone Number:       Current Level of Care: Hospital Recommended Level of Care: [place] Prior Approval Number:    Date Approved/Denied:   PASRR Number: [id number])  Discharge Plan: SNF    Current Diagnoses: Patient Active Problem List   Diagnosis Date Noted  . Hyperlipidemia due to type 1 diabetes mellitus ([place]) 09/06/2018  . Type 1 diabetes mellitus with microalbuminuria ([place]) 09/06/2018  . Total knee replacement status 09/06/2018  . Hypertensive retinopathy, grade 2, bilateral 06/17/2018  . 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 Hypoglycemia associated with diabetes ([place]) 04/27/2014  . Insulin pump titration 04/27/2014  . Microalbuminuria 04/27/2014  . GERD (gastroesophageal reflux disease) 06/07/2013  . Hypertension associated with diabetes ([place]) 06/07/2013    Orientation RESPIRATION BLADDER Height & Weight     Self, Time, Situation, Place  Normal Continent Weight: 182 lb 1.6 oz (82.6 kg) Height:  5\' 1"  (154.9 cm)  BEHAVIORAL SYMPTOMS/MOOD NEUROLOGICAL BOWEL NUTRITION STATUS      Continent Diet(Diet: Regular )  AMBULATORY STATUS COMMUNICATION OF NEEDS Skin   Extensive Assist Verbally Surgical wounds(Incision: Right Knee. )                       Personal Care Assistance Level of Assistance  Bathing, Feeding, Dressing Bathing Assistance: Limited assistance Feeding assistance: Independent Dressing Assistance: Limited  assistance     Functional Limitations Info  Sight, Hearing, Speech Sight Info: Adequate Hearing Info: Adequate Speech Info: Adequate    SPECIAL CARE FACTORS FREQUENCY  PT (By licensed PT), OT (By licensed OT)     PT Frequency: (5) OT Frequency: (5)            Contractures      Additional Factors Info  Code Status, Allergies Code Status Info: (Full Code. ) Allergies Info: (Kiwi Extract, Septra Sulfamethoxazole-trimethoprim, Adhesive Tape, Bactroban Mupirocin Calcium, Insulin Glargine, Lisinopril, Simvastatin, Cephalexin, Dexilant Dexlansoprazole)           Current Medications (09/07/2018):  This is the current hospital active medication list Current Facility-Administered Medications  Medication Dose Route Frequency Provider Last Rate Last Dose  . 0.9 %  sodium chloride infusion   Intravenous Continuous Hooten, Laurice Record, MD 100 mL/hr at 09/07/18 0122    . acetaminophen (OFIRMEV) IV 1,000 mg  1,000 mg Intravenous Q6H Hooten, Laurice Record, MD   Stopped at 09/07/18 [phone]  . acetaminophen (TYLENOL) tablet 325-650 mg  325-650 mg Oral Q6H PRN Hooten, Laurice Record, MD      . alum & mag hydroxide-simeth (MAALOX/MYLANTA) 200-200-20 MG/5ML suspension 30 mL  30 mL Oral Q4H PRN Hooten, Laurice Record, MD      . amLODipine (NORVASC) tablet 10 mg  10 mg Oral Daily Hooten, Laurice Record, MD   10 mg at 09/07/18 0813  . bisacodyl (DULCOLAX) suppository 10 mg  10 mg Rectal Daily PRN Hooten, Laurice Record, MD      . celecoxib (CELEBREX) capsule 200 mg  200 mg Oral BID  Dereck Leep, MD   200 mg at 09/07/18 4854  . clindamycin (CLEOCIN) IVPB 600 mg  600 mg Intravenous Q6H Hooten, Laurice Record, MD   Stopped at 09/07/18 0157  . diphenhydrAMINE (BENADRYL) 12.5 MG/5ML elixir 12.5-25 mg  12.5-25 mg Oral Q4H PRN Hooten, Laurice Record, MD      . enoxaparin (LOVENOX) injection 30 mg  30 mg Subcutaneous Q12H Hooten, Laurice Record, MD   30 mg at 09/07/18 0811  . ferrous sulfate tablet 325 mg  325 mg Oral BID WC Dereck Leep, MD   325 mg at  09/07/18 6270  . gabapentin (NEURONTIN) capsule 300 mg  300 mg Oral QHS Hooten, Laurice Record, MD   300 mg at 09/06/18 2035  . HYDROmorphone (DILAUDID) injection 0.5-1 mg  0.5-1 mg Intravenous Q4H PRN Hooten, Laurice Record, MD      . insulin pump   Subcutaneous TID AC, HS, 0200 Hooten, Laurice Record, MD   1 each at 09/06/18 2100  . loratadine (CLARITIN) tablet 10 mg  10 mg Oral Daily Hooten, Laurice Record, MD      . losartan (COZAAR) tablet 100 mg  100 mg Oral Daily Hooten, Laurice Record, MD   100 mg at 09/07/18 0813  . magnesium hydroxide (MILK OF MAGNESIA) suspension 30 mL  30 mL Oral Daily Hooten, Laurice Record, MD   30 mL at 09/07/18 0811  . menthol-cetylpyridinium (CEPACOL) lozenge 3 mg  1 lozenge Oral PRN Hooten, Laurice Record, MD       Or  . phenol (CHLORASEPTIC) mouth spray 1 spray  1 spray Mouth/Throat PRN Hooten, Laurice Record, MD      . metoCLOPramide (REGLAN) tablet 5-10 mg  5-10 mg Oral Q8H PRN Hooten, Laurice Record, MD       Or  . metoCLOPramide (REGLAN) injection 5-10 mg  5-10 mg Intravenous Q8H PRN Hooten, Laurice Record, MD      . metoCLOPramide (REGLAN) tablet 10 mg  10 mg Oral TID AC & HS Hooten, Laurice Record, MD   10 mg at 09/07/18 3500  . ondansetron (ZOFRAN) tablet 4 mg  4 mg Oral Q6H PRN Hooten, Laurice Record, MD       Or  . ondansetron (ZOFRAN) injection 4 mg  4 mg Intravenous Q6H PRN Hooten, Laurice Record, MD      . oxyCODONE (Oxy IR/ROXICODONE) immediate release tablet 10 mg  10 mg Oral Q4H PRN Hooten, Laurice Record, MD      . oxyCODONE (Oxy IR/ROXICODONE) immediate release tablet 5 mg  5 mg Oral Q4H PRN Hooten, Laurice Record, MD   5 mg at 09/07/18 0815  . pantoprazole (PROTONIX) EC tablet 40 mg  40 mg Oral BID Dereck Leep, MD   40 mg at 09/07/18 0814  . pravastatin (PRAVACHOL) tablet 20 mg  20 mg Oral QHS Hooten, Laurice Record, MD   20 mg at 09/06/18 2035  . senna-docusate (Senokot-S) tablet 1 tablet  1 tablet Oral BID Dereck Leep, MD   1 tablet at 09/07/18 0813  . sodium phosphate (FLEET) 7-19 GM/118ML enema 1 enema  1 enema Rectal Once PRN Hooten,  Laurice Record, MD      . traMADol Veatrice [place]) tablet 50-100 mg  50-100 mg Oral Q4H PRN Hooten, Laurice Record, MD         Discharge Medications: Please see discharge summary for a list of discharge medications.  Relevant Imaging Results:  Relevant Lab Results:   Additional Information (SSN: [id number])  Rayven Rettig, Veronia Beets,  LCSW

## 2018-09-07 NOTE — Care Management (Signed)
[facility] has accepted the patient for Home PT

## 2018-09-08 ENCOUNTER — Encounter: Payer: Self-pay | Admitting: Orthopedic Surgery

## 2018-09-08 LAB — GLUCOSE, CAPILLARY
GLUCOSE-CAPILLARY: 190 mg/dL — AB (ref 70–99)
Glucose-Capillary: 246 mg/dL — ABNORMAL HIGH (ref 70–99)

## 2018-09-08 MED ORDER — LACTULOSE 10 GM/15ML PO SOLN: 10.0000 g | Freq: Two times a day (BID) | ORAL | Status: DC | PRN

## 2018-09-08 NOTE — Progress Notes (Signed)
Pt. Discharged to home via husbands vehicle. Discharge instructions and medication regimen reviewed at bedside with patient. Pt. verbalizes understanding of instructions and medication regimen. Prescriptions sent to pharmacy. Patient assessment unchanged from this morning. IV discontinued per policy. Operative dressing changed and leg cleansed.

## 2018-09-08 NOTE — Progress Notes (Signed)
   Subjective: 2 Days Post-Op Procedure(s) (LRB): COMPUTER ASSISTED TOTAL KNEE ARTHROPLASTY (Right) Patient reports pain as 3 on 0-10 scale.   Patient is well, and has had no acute complaints or problems Patient did well with therapy yesterday.  Met all goals to go home Plan is to go Home after hospital stay. no nausea and no vomiting Patient denies any chest pains or shortness of breath. Objective: Vital signs in last 24 hours: Temp:  [97.8 F (36.6 C)-97.9 F (36.6 C)] 97.9 F (36.6 C) (01/14 2232) Pulse Rate:  [68-84] 84 (01/14 2232) Resp:  [18-19] 19 (01/14 2232) BP: (108-130)/(49-60) 130/59 (01/14 2232) SpO2:  [98 %-100 %] 98 % (01/14 2232) well approximated incision Heels are non tender and elevated off the bed using rolled towels Intake/Output from previous day: 01/14 0701 - 01/15 0700 In: 1657 [P.O.:240; I.V.:814; IV Piggyback:300] Out: 10 [Drains:10] Intake/Output this shift: No intake/output data recorded.  No results for input(s): HGB in the last 72 hours. No results for input(s): WBC, RBC, HCT, PLT in the last 72 hours. No results for input(s): NA, K, CL, CO2, BUN, CREATININE, GLUCOSE, CALCIUM in the last 72 hours. No results for input(s): LABPT, INR in the last 72 hours.  EXAM General - Patient is Alert, Appropriate and Oriented Extremity - Neurologically intact Neurovascular intact Sensation intact distally Intact pulses distally Dorsiflexion/Plantar flexion intact No cellulitis present Compartment soft Dressing - dressing C/D/I Motor Function - intact, moving foot and toes well on exam.  Able to do straight leg raise on her own  Past Medical History:  Diagnosis Date  . Arthritis    OSTEOARTHRITIS  . Asthma due to seasonal allergies    SEVERE sinus drainage. difficulty breathing if laying flat  . Baker's cyst of knee   . Cancer ([place])    basal cell  . Dental root implant present   . Diabetes mellitus without complication (HCC)    TYPE I, USES  INSULIN PUMP  . Diabetic arthropathy ([place])   . GERD (gastroesophageal reflux disease)   . Glaucoma 2019  . Hyperlipidemia   . Hypertension   . Insulin pump in place 08/2018  . Microalbuminuria   . Shingles 2014    Assessment/Plan: 2 Days Post-Op Procedure(s) (LRB): COMPUTER ASSISTED TOTAL KNEE ARTHROPLASTY (Right) Active Problems:   Total knee replacement status  Estimated body mass index is 34.41 kg/m as calculated from the following:   Height as of this encounter: '5\' 1"'$  (1.549 m).   Weight as of this encounter: 82.6 kg. Up with therapy Discharge home with home health  Labs: None DVT Prophylaxis - Lovenox, Foot Pumps and TED hose Weight-Bearing as tolerated to right leg Hemovac was discontinued today.  Into the drain appeared to be intact Patient may be discharged home after therapy this morning. Please wash operative leg, change dressing apply TED stockings to right leg prior to being discharged Please give the patient 2 extra honeycomb dressings to take home Be sure bone foam goes home with patient    Jillyn Ledger. [place] 09/08/2018, 7:54 AM

## 2018-09-08 NOTE — Care Management (Signed)
[facility] brought DME to room for Patient to DC home

## 2018-09-08 NOTE — Progress Notes (Signed)
Physical Therapy Treatment Patient Details Name: Cindy Sandoval MRN: [id number] DOB: 08-Jan-1951 Today's Date: 09/08/2018    History of Present Illness Patient is a 68 year old female admitted for R TKA 09/06/18.  PMH includes IDDM (insulin pump), htn, glaucoma, GERD, asthma.     PT Comments    Participated in exercises as described below.  Ambulated around unit x 1 and completed stair training.  No further questions or concerns.  Progressing well with 0-112 ROM in sitting limited by railings under bed.   Follow Up Recommendations  Home health PT     Equipment Recommendations  Rolling walker with 5" wheels    Recommendations for Other Services       Precautions / Restrictions Precautions Precautions: Fall Required Braces or Orthoses: Knee Immobilizer - Right Knee Immobilizer - Right: Discontinue once straight leg raise with < 10 degree lag Restrictions Weight Bearing Restrictions: Yes RLE Weight Bearing: Weight bearing as tolerated Other Position/Activity Restrictions: No KI used as she had no lag x 10 I SLR    Mobility  Bed Mobility Overal bed mobility: Modified Independent                Transfers Overall transfer level: Modified independent   Transfers: Sit to/from Stand Sit to Stand: Modified independent (Device/Increase time)            Ambulation/Gait Ambulation/Gait assistance: Modified independent (Device/Increase time) Gait Distance (Feet): 230 Feet Assistive device: Rolling walker (2 wheeled) Gait Pattern/deviations: Step-through pattern Gait velocity: mildly decreased       Stairs Stairs: Yes Stairs assistance: Min guard Stair Management: Two rails;One rail Right;Step to pattern;Forwards;Sideways Number of Stairs: 4     Wheelchair Mobility    Modified Rankin (Stroke Patients Only)       Balance Overall balance assessment: Needs assistance Sitting-balance support: No upper extremity supported;Feet supported Sitting  balance-Leahy Scale: Normal     Standing balance support: Bilateral upper extremity supported Standing balance-Leahy Scale: Good Standing balance comment: steady standing reaching within BOS                            Cognition Arousal/Alertness: Awake/alert Behavior During Therapy: WFL for tasks assessed/performed Overall Cognitive Status: Within Functional Limits for tasks assessed                                        Exercises Total Joint Exercises Ankle Circles/Pumps: AROM;5 reps Quad Sets: AROM;10 reps;Strengthening Heel Slides: AROM;10 reps;Supine;Strengthening Straight Leg Raises: Supine;AROM;10 reps;Strengthening Long Arc Quad: Seated;AROM;10 reps;Strengthening Knee Flexion: AAROM;5 reps;Seated;Strengthening Goniometric ROM: 0-112 limited by railings under bed    General Comments        Pertinent Vitals/Pain Pain Assessment: 0-10 Pain Score: 2  Pain Location:  R knee Pain Descriptors / Indicators: Sore Pain Intervention(s): Limited activity within patient's tolerance;RN gave pain meds during session    Home Living                      Prior Function            PT Goals (current goals can now be found in the care plan section) Progress towards PT goals: Progressing toward goals    Frequency    BID      PT Plan Current plan remains appropriate    Co-evaluation  AM-PAC PT "6 Clicks" Mobility   Outcome Measure  Help needed turning from your back to your side while in a flat bed without using bedrails?: None Help needed moving from lying on your back to sitting on the side of a flat bed without using bedrails?: None Help needed moving to and from a bed to a chair (including a wheelchair)?: None Help needed standing up from a chair using your arms (e.g., wheelchair or bedside chair)?: None Help needed to walk in hospital room?: None Help needed climbing 3-5 steps with a railing? : A Little 6  Click Score: 23    End of Session Equipment Utilized During Treatment: Gait belt Activity Tolerance: Patient tolerated treatment well Patient left: in chair;with call bell/phone within reach;with family/visitor present;with chair alarm set Nurse Communication: Mobility status Pain - Right/Left: Right Pain - part of body: Knee     Time: 2902-1115 PT Time Calculation (min) (ACUTE ONLY): 19 min  Charges:  $Gait Training: 8-22 mins                     Chesley Noon, PTA 09/08/18, 9:23 AM

## 2018-09-08 NOTE — Care Management (Signed)
Checked price of Lovenox $97.44 with insurance. Notified the patient. Notified Corene Cornea with [facility] that patient is waiting on equipment to go home, he stated that it will be up to room soon.

## 2018-09-08 NOTE — Care Management (Signed)
I explained to the patient that [facility] is bringing the Bedside commode and the walker, however they had to go to their Distribution center to pick them up.  She has agreed to wait approx 20 min to DC home so she can have the equipment she needs

## 2018-10-07 DIAGNOSIS — E103513 Type 1 diabetes mellitus with proliferative diabetic retinopathy with macular edema, bilateral: Secondary | ICD-10-CM | POA: Insufficient documentation

## 2018-10-07 DIAGNOSIS — IMO0002 Reserved for concepts with insufficient information to code with codable children: Secondary | ICD-10-CM | POA: Insufficient documentation

## 2019-01-20 DIAGNOSIS — H40053 Ocular hypertension, bilateral: Secondary | ICD-10-CM | POA: Insufficient documentation

## 2019-06-21 ENCOUNTER — Other Ambulatory Visit: Payer: Self-pay | Admitting: Nurse Practitioner

## 2019-06-21 DIAGNOSIS — Z1231 Encounter for screening mammogram for malignant neoplasm of breast: Secondary | ICD-10-CM

## 2019-06-29 ENCOUNTER — Ambulatory Visit
Admission: RE | Admit: 2019-06-29 | Discharge: 2019-06-29 | Disposition: A | Discharge status: Home / Self Care | Payer: Medicare Other | Source: Ambulatory Visit | Attending: Nurse Practitioner | Admitting: Nurse Practitioner

## 2019-06-29 ENCOUNTER — Other Ambulatory Visit: Payer: Self-pay

## 2019-06-29 DIAGNOSIS — Z1231 Encounter for screening mammogram for malignant neoplasm of breast: Secondary | ICD-10-CM | POA: Diagnosis not present

## 2019-08-31 ENCOUNTER — Ambulatory Visit: Payer: Medicare Other

## 2020-02-13 ENCOUNTER — Other Ambulatory Visit: Payer: Self-pay | Admitting: Neurology

## 2020-02-13 DIAGNOSIS — R2689 Other abnormalities of gait and mobility: Secondary | ICD-10-CM

## 2020-02-13 DIAGNOSIS — R42 Dizziness and giddiness: Secondary | ICD-10-CM

## 2020-03-01 ENCOUNTER — Other Ambulatory Visit: Payer: Self-pay

## 2020-03-01 ENCOUNTER — Ambulatory Visit
Admission: RE | Admit: 2020-03-01 | Discharge: 2020-03-01 | Disposition: A | Discharge status: Home / Self Care | Payer: Medicare Other | Source: Ambulatory Visit | Attending: Neurology | Admitting: Neurology

## 2020-03-01 DIAGNOSIS — R42 Dizziness and giddiness: Secondary | ICD-10-CM | POA: Insufficient documentation

## 2020-03-01 DIAGNOSIS — R2689 Other abnormalities of gait and mobility: Secondary | ICD-10-CM | POA: Diagnosis present

## 2020-03-01 MED ORDER — GADOBUTROL 1 MMOL/ML IV SOLN
8.0000 mL | Freq: Once | INTRAVENOUS | Status: AC | PRN
  Administered 2020-03-01: 8 mL via INTRAVENOUS

## 2020-05-09 ENCOUNTER — Ambulatory Visit (INDEPENDENT_AMBULATORY_CARE_PROVIDER_SITE_OTHER): Payer: Medicare Other | Admitting: Podiatry

## 2020-05-09 ENCOUNTER — Other Ambulatory Visit: Payer: Self-pay

## 2020-05-09 ENCOUNTER — Encounter: Payer: Self-pay | Admitting: Podiatry

## 2020-05-09 DIAGNOSIS — E109 Type 1 diabetes mellitus without complications: Secondary | ICD-10-CM | POA: Diagnosis not present

## 2020-05-09 NOTE — Progress Notes (Signed)
Subjective:  Patient ID: Cindy Sandoval, female    DOB: [birth year],  MRN: [id number] HPI Chief Complaint  Patient presents with  . Nail Problem    Hallux left - toenail removed by Dr. Paulla Dolly years ago and has piece that has grown back, not sore, diabetic - requesting foot exam - last a1c was 6.1  . New Patient (Initial Visit)    69 y.o. female presents with the above complaint.   ROS: Denies fever chills nausea vomiting muscle aches pains calf pain back pain chest pain shortness of breath.  Past Medical History:  Diagnosis Date  . Arthritis    OSTEOARTHRITIS  . Asthma due to seasonal allergies    SEVERE sinus drainage. difficulty breathing if laying flat  . Baker's cyst of knee   . Cancer ([place])    basal cell  . Dental root implant present   . Diabetes mellitus without complication (HCC)    TYPE I, USES INSULIN PUMP  . Diabetic arthropathy ([place])   . GERD (gastroesophageal reflux disease)   . Glaucoma 2019  . Hyperlipidemia   . Hypertension   . Insulin pump in place 08/2018  . Microalbuminuria   . Shingles 2014   Past Surgical History:  Procedure Laterality Date  . BREAST BIOPSY Left 1982   neg  . COLONOSCOPY    . COLONOSCOPY WITH PROPOFOL N/A 10/20/2016   Procedure: COLONOSCOPY WITH PROPOFOL;  Surgeon: Lollie Sails, MD;  Location: [facility] ENDOSCOPY;  Service: Endoscopy;  Laterality: N/A;  . ESOPHAGOGASTRODUODENOSCOPY    . EYE SURGERY Bilateral    trabeculoplasty 06/2018  . KNEE ARTHROPLASTY Right 09/06/2018   Procedure: COMPUTER ASSISTED TOTAL KNEE ARTHROPLASTY;  Surgeon: Dereck Leep, MD;  Location: [facility] ORS;  Service: Orthopedics;  Laterality: Right;  . PANRETINAL PHOTOCOAGULATION Left 07/2018  . POPLITEAL SYNOVIAL CYST EXCISION Left 2005  . REFRACTIVE SURGERY Bilateral    blood vessels draining/bleeding . pressures still elevated    Current Outpatient Medications:  .  Azelastine-Fluticasone 137-50 MCG/ACT SUSP, Place into the nose., Disp: , Rfl:  .   amLODipine (NORVASC) 10 MG tablet, Take 10 mg by mouth daily., Disp: , Rfl:  .  cetirizine (ZYRTEC) 10 MG tablet, Take 10 mg by mouth daily., Disp: , Rfl:  .  diclofenac sodium (VOLTAREN) 1 % GEL, Apply 1 application topically 3 (three) times daily as needed (pain). , Disp: , Rfl:  .  enoxaparin (LOVENOX) 40 MG/0.4ML injection, Inject 0.4 mLs (40 mg total) into the skin daily for 14 days., Disp: 14 Syringe, Rfl: 0 .  insulin aspart (NOVOLOG) 100 UNIT/ML injection, Inject into the skin. Pt uses in INSULIN PUMP, Disp: , Rfl:  .  insulin detemir (LEVEMIR) 100 UNIT/ML injection, Inject into the skin. Uses as a backup if PUMP gives out, Disp: , Rfl:  .  Insulin Human (INSULIN PUMP) SOLN, Inject into the skin., Disp: , Rfl:  .  Insulin Infusion Pump (T:SLIM G4 INSULIN PUMP) DEVI, Inject into the skin., Disp: , Rfl:  .  latanoprost (XALATAN) 0.005 % ophthalmic solution, Place 1 drop into the left eye at bedtime., Disp: , Rfl:  .  levocetirizine (XYZAL) 5 MG tablet, SMARTSIG:1 Tablet(s) By Mouth Every Evening, Disp: , Rfl:  .  losartan (COZAAR) 100 MG tablet, Take 100 mg by mouth daily., Disp: , Rfl:  .  Melatonin 10 MG/ML LIQD, Take 5 mg by mouth at bedtime as needed., Disp: , Rfl:  .  omeprazole (PRILOSEC) 40 MG capsule, Take 40 mg by  mouth daily., Disp: , Rfl:  .  pravastatin (PRAVACHOL) 20 MG tablet, Take 20 mg by mouth at bedtime. , Disp: , Rfl:   Allergies  Allergen Reactions  . Kiwi Extract Anaphylaxis  . Septra [Sulfamethoxazole-Trimethoprim] Nausea And Vomiting    Required patient to be hospitalized after taking this  . Adhesive [Tape] Rash    tegaderm is okay. Broke out in rash around pump with plastic tape. USE TAPE SPARINGLY  . Bactroban [Mupirocin Calcium] Swelling    Used around knee and caused swelling  . Insulin Glargine Hives    lantus caused problem  . Lisinopril Other (See Comments)    BODY ACHED EVERYWHERE. NO ENERGY   . Simvastatin Other (See Comments)    BODY ACHED  EVERYWHERE  . Cephalexin Diarrhea  . Dexilant [Dexlansoprazole] Other (See Comments)    Did not help digestive issues  . Mupirocin Swelling    Redness at site   Review of Systems Objective:  There were no vitals filed for this visit.  General: Well developed, nourished, in no acute distress, alert and oriented x3   Dermatological: Skin is warm, dry and supple bilateral. Nails x 10 are well maintained; remaining integument appears unremarkable at this time. There are no open sores, no preulcerative lesions, no rash or signs of infection present.  Vascular: Dorsalis Pedis artery and Posterior Tibial artery pedal pulses are 2/4 bilateral with immedate capillary fill time. Pedal hair growth present. No varicosities and no lower extremity edema present bilateral.   Neruologic: Grossly intact via light touch bilateral. Vibratory intact via tuning fork bilateral. Protective threshold with Semmes Wienstein monofilament intact to all pedal sites bilateral. Patellar and Achilles deep tendon reflexes 2+ bilateral. No Babinski or clonus noted bilateral.   Musculoskeletal: No gross boney pedal deformities bilateral. No pain, crepitus, or limitation noted with foot and ankle range of motion bilateral. Muscular strength 5/5 in all groups tested bilateral.  Gait: Unassisted, Nonantalgic.    Radiographs:  None taken  Assessment & Plan:   Assessment: Diabetes mellitus without complications.  A portion of a previously removed nail remains medial border hallux left.  This is mildly tender does not appear to be infected.  Plan: Discussed etiology pathology conservative surgical therapies offered to remove the remaining border of this nail however she declined at this time would like to follow-up with me in 6 months to have that done.     Jermon Chalfant T. [place], [place]

## 2020-05-21 ENCOUNTER — Other Ambulatory Visit: Payer: Self-pay | Admitting: Nurse Practitioner

## 2020-06-21 ENCOUNTER — Other Ambulatory Visit: Payer: Self-pay | Admitting: Nurse Practitioner

## 2020-06-21 DIAGNOSIS — Z1231 Encounter for screening mammogram for malignant neoplasm of breast: Secondary | ICD-10-CM

## 2020-07-06 DIAGNOSIS — Z20822 Contact with and (suspected) exposure to covid-19: Secondary | ICD-10-CM | POA: Insufficient documentation

## 2020-07-11 DIAGNOSIS — M810 Age-related osteoporosis without current pathological fracture: Secondary | ICD-10-CM | POA: Insufficient documentation

## 2020-07-18 ENCOUNTER — Ambulatory Visit: Payer: Medicare Other

## 2020-08-02 ENCOUNTER — Other Ambulatory Visit: Payer: Self-pay

## 2020-08-02 ENCOUNTER — Ambulatory Visit
Admission: RE | Admit: 2020-08-02 | Discharge: 2020-08-02 | Disposition: A | Discharge status: Home / Self Care | Payer: Medicare Other | Source: Ambulatory Visit | Attending: Nurse Practitioner | Admitting: Nurse Practitioner

## 2020-08-02 DIAGNOSIS — Z1231 Encounter for screening mammogram for malignant neoplasm of breast: Secondary | ICD-10-CM

## 2020-11-07 ENCOUNTER — Ambulatory Visit: Payer: Medicare Other | Admitting: Podiatry

## 2021-05-08 ENCOUNTER — Ambulatory Visit: Payer: Medicare Other | Admitting: Podiatry

## 2021-05-29 ENCOUNTER — Encounter: Payer: Self-pay | Admitting: Podiatry

## 2021-05-29 ENCOUNTER — Ambulatory Visit (INDEPENDENT_AMBULATORY_CARE_PROVIDER_SITE_OTHER): Payer: Medicare Other | Admitting: Podiatry

## 2021-05-29 ENCOUNTER — Other Ambulatory Visit: Payer: Self-pay

## 2021-05-29 DIAGNOSIS — E109 Type 1 diabetes mellitus without complications: Secondary | ICD-10-CM | POA: Diagnosis not present

## 2021-05-29 DIAGNOSIS — M76812 Anterior tibial syndrome, left leg: Secondary | ICD-10-CM

## 2021-05-29 MED ORDER — DEXAMETHASONE SODIUM PHOSPHATE 120 MG/30ML IJ SOLN
2.0000 mg | Freq: Once | INTRAMUSCULAR | Status: AC
  Administered 2021-05-29: 2 mg via INTRA_ARTICULAR

## 2021-05-29 NOTE — Progress Notes (Signed)
She presents today for her yearly diabetic foot exam.  She denies fever chills nausea vomiting muscle aches pains calf pain back pain chest pain shortness of breath.  Denies any new changes in her feet.  Objective: Vital signs are stable she is alert and oriented x3 pulses are palpable.  Capillary fill time is immediate neurologic sensorium is intact deep tendon reflexes are intact muscle strength is normal symmetrical cutaneous evaluation demonstrates supple well-hydrated cutis no erythema edema cellulitis drainage or odor.  Assessment: Diabetes mellitus without complications.  Plan: Follow-up with her in 1 year.

## 2021-06-25 ENCOUNTER — Other Ambulatory Visit: Payer: Self-pay | Admitting: Nurse Practitioner

## 2021-06-25 DIAGNOSIS — Z1231 Encounter for screening mammogram for malignant neoplasm of breast: Secondary | ICD-10-CM

## 2021-08-06 ENCOUNTER — Other Ambulatory Visit: Payer: Self-pay

## 2021-08-06 ENCOUNTER — Ambulatory Visit
Admission: RE | Admit: 2021-08-06 | Discharge: 2021-08-06 | Disposition: A | Discharge status: Home / Self Care | Payer: Medicare Other | Source: Ambulatory Visit | Attending: Nurse Practitioner | Admitting: Nurse Practitioner

## 2021-08-06 DIAGNOSIS — Z1231 Encounter for screening mammogram for malignant neoplasm of breast: Secondary | ICD-10-CM | POA: Diagnosis present

## 2021-10-17 DIAGNOSIS — H4311 Vitreous hemorrhage, right eye: Secondary | ICD-10-CM | POA: Insufficient documentation

## 2022-03-11 ENCOUNTER — Other Ambulatory Visit: Payer: Self-pay | Admitting: Nurse Practitioner

## 2022-03-11 DIAGNOSIS — R1011 Right upper quadrant pain: Secondary | ICD-10-CM

## 2022-03-11 DIAGNOSIS — Z86010 Personal history of colonic polyps: Secondary | ICD-10-CM

## 2022-03-18 ENCOUNTER — Ambulatory Visit
Admission: RE | Admit: 2022-03-18 | Discharge: 2022-03-18 | Disposition: A | Discharge status: Home / Self Care | Payer: Medicare Other | Source: Ambulatory Visit | Attending: Nurse Practitioner | Admitting: Nurse Practitioner

## 2022-03-18 DIAGNOSIS — R1011 Right upper quadrant pain: Secondary | ICD-10-CM | POA: Diagnosis present

## 2022-03-18 DIAGNOSIS — Z86010 Personal history of colonic polyps: Secondary | ICD-10-CM | POA: Insufficient documentation

## 2022-04-03 ENCOUNTER — Other Ambulatory Visit: Payer: Self-pay | Admitting: Nurse Practitioner

## 2022-04-03 DIAGNOSIS — R1011 Right upper quadrant pain: Secondary | ICD-10-CM

## 2022-04-10 ENCOUNTER — Ambulatory Visit
Admission: RE | Admit: 2022-04-10 | Discharge: 2022-04-10 | Disposition: A | Discharge status: Home / Self Care | Payer: Medicare Other | Source: Ambulatory Visit | Attending: Nurse Practitioner | Admitting: Nurse Practitioner

## 2022-04-10 DIAGNOSIS — R1011 Right upper quadrant pain: Secondary | ICD-10-CM | POA: Insufficient documentation

## 2022-04-10 MED ORDER — IOHEXOL 300 MG/ML  SOLN
100.0000 mL | Freq: Once | INTRAMUSCULAR | Status: AC | PRN
Start: 2022-04-10 — End: 2022-04-10
  Administered 2022-04-10: 100 mL via INTRAVENOUS

## 2022-04-18 NOTE — Discharge Instructions (Signed)
Instructions after Total Knee Replacement   Jamyla Ard P. Joaovictor Krone, Jr., M.D.     Dept. of Orthopaedics & Sports Medicine  [facility]  [address]  [place], [place]  [address]  Phone: [phone]   Fax: [phone]    DIET: Drink plenty of non-alcoholic fluids. Resume your normal diet. Include foods high in fiber.  ACTIVITY:  You may use crutches or a walker with weight-bearing as tolerated, unless instructed otherwise. You may be weaned off of the walker or crutches by your Physical Therapist.  Do NOT place pillows under the knee. Anything placed under the knee could limit your ability to straighten the knee.   Continue doing gentle exercises. Exercising will reduce the pain and swelling, increase motion, and prevent muscle weakness.   Please continue to use the TED compression stockings for 6 weeks. You may remove the stockings at night, but should reapply them in the morning. Do not drive or operate any equipment until instructed.  WOUND CARE:  Continue to use the PolarCare or ice packs periodically to reduce pain and swelling. You may bathe or shower after the staples are removed at the first office visit following surgery.  MEDICATIONS: You may resume your regular medications. Please take the pain medication as prescribed on the medication. Do not take pain medication on an empty stomach. You have been given a prescription for a blood thinner (Lovenox or Coumadin). Please take the medication as instructed. (NOTE: After completing a 2 week course of Lovenox, take one Enteric-coated aspirin once a day. This along with elevation will help reduce the possibility of phlebitis in your operated leg.) Do not drive or drink alcoholic beverages when taking pain medications.  CALL THE OFFICE FOR: Temperature above 101 degrees Excessive bleeding or drainage on the dressing. Excessive swelling, coldness, or paleness of the toes. Persistent nausea and vomiting.  FOLLOW-UP:  You  should have an appointment to return to the office in 10-14 days after surgery. Arrangements have been made for continuation of Physical Therapy (either home therapy or outpatient therapy).   [facility] Department Directory         [email or web address]       [email or web address]          Cardiology  Appointments: [place] - [phone] [place] - [phone]  Endocrinology  Appointments: [place] - [phone] [place] - [phone]  Gastroenterology  Appointments: [place] - [phone] [place] - [phone]        General Surgery   Appointments: [place] - [phone]  Internal Medicine/Family Medicine  Appointments: [place] - [phone] [place] - [phone] [place] - [phone]  Metabolic and Weigh Loss Surgery  Appointments: [place] - [phone]        Neurology  Appointments: [place] - [phone] [place] - [phone]  Neurosurgery  Appointments: [place] - [phone]  Obstetrics & Gynecology  Appointments: [place] - [phone] [place] - [phone]        Pediatrics  Appointments: [place] - [phone] [place] - [phone]  Physiatry  Appointments: [place] -[phone]  Physical Therapy  Appointments: [place] - [phone] [place] - [phone]        Podiatry  Appointments: [place] - [phone] [place] - [phone]  Pulmonology  Appointments: [place] - [phone]  Rheumatology  Appointments: [place] - [phone]        [place] Location: [facility]  [address] , [place]  [address]  [place] Location: [facility] [address] [place], [place]  [address]  [place] Location: [facility] [address] [place],   [address]    

## 2022-04-29 ENCOUNTER — Encounter
Admission: RE | Admit: 2022-04-29 | Discharge: 2022-04-29 | Disposition: A | Discharge status: Home / Self Care | Payer: Medicare Other | Source: Ambulatory Visit | Attending: Orthopedic Surgery | Admitting: Orthopedic Surgery

## 2022-04-29 VITALS — BP 136/64 | HR 76 | Resp 15 | Ht 61.0 in | Wt 188.0 lb

## 2022-04-29 DIAGNOSIS — R9431 Abnormal electrocardiogram [ECG] [EKG]: Secondary | ICD-10-CM | POA: Insufficient documentation

## 2022-04-29 DIAGNOSIS — M1712 Unilateral primary osteoarthritis, left knee: Secondary | ICD-10-CM | POA: Diagnosis not present

## 2022-04-29 DIAGNOSIS — Z79899 Other long term (current) drug therapy: Secondary | ICD-10-CM | POA: Diagnosis not present

## 2022-04-29 DIAGNOSIS — Z01818 Encounter for other preprocedural examination: Secondary | ICD-10-CM | POA: Insufficient documentation

## 2022-04-29 DIAGNOSIS — E103593 Type 1 diabetes mellitus with proliferative diabetic retinopathy without macular edema, bilateral: Secondary | ICD-10-CM

## 2022-04-29 DIAGNOSIS — Z881 Allergy status to other antibiotic agents status: Secondary | ICD-10-CM

## 2022-04-29 DIAGNOSIS — Z96652 Presence of left artificial knee joint: Secondary | ICD-10-CM

## 2022-04-29 DIAGNOSIS — E1029 Type 1 diabetes mellitus with other diabetic kidney complication: Secondary | ICD-10-CM

## 2022-04-29 DIAGNOSIS — Z01810 Encounter for preprocedural cardiovascular examination: Secondary | ICD-10-CM | POA: Diagnosis not present

## 2022-04-29 HISTORY — DX: Family history of other specified conditions: Z84.89

## 2022-04-29 HISTORY — DX: Fatty (change of) liver, not elsewhere classified: K76.0

## 2022-04-29 LAB — COMPREHENSIVE METABOLIC PANEL
ALT: 15 U/L (ref 0–44)
AST: 19 U/L (ref 15–41)
Albumin: 4.4 g/dL (ref 3.5–5.0)
Alkaline Phosphatase: 62 U/L (ref 38–126)
Anion gap: 12 (ref 5–15)
BUN: 17 mg/dL (ref 8–23)
CO2: 25 mmol/L (ref 22–32)
Calcium: 9.1 mg/dL (ref 8.9–10.3)
Chloride: 95 mmol/L — ABNORMAL LOW (ref 98–111)
Creatinine, Ser: 0.7 mg/dL (ref 0.44–1.00)
GFR, Estimated: >60 mL/min (ref >60)
Glucose, Bld: 122 mg/dL — ABNORMAL HIGH (ref 70–99)
Potassium: 4.2 mmol/L (ref 3.5–5.1)
Sodium: 132 mmol/L — ABNORMAL LOW (ref 135–145)
Total Bilirubin: 0.9 mg/dL (ref 0.3–1.2)
Total Protein: 7.8 g/dL (ref 6.5–8.1)

## 2022-04-29 LAB — HEMOGLOBIN A1C
Hgb A1c MFr Bld: 6.3 % — ABNORMAL HIGH (ref 4.8–5.6)
Mean Plasma Glucose: 134.11 mg/dL

## 2022-04-29 LAB — URINALYSIS, ROUTINE W REFLEX MICROSCOPIC
Bilirubin Urine: NEGATIVE
Glucose, UA: NEGATIVE mg/dL
Hgb urine dipstick: NEGATIVE
Ketones, ur: NEGATIVE mg/dL
Leukocytes,Ua: NEGATIVE
Nitrite: NEGATIVE
Protein, ur: NEGATIVE mg/dL
Specific Gravity, Urine: 1.011 (ref 1.005–1.030)
pH: 7 (ref 5.0–8.0)

## 2022-04-29 LAB — TYPE AND SCREEN
ABO/RH(D): O POS
Antibody Screen: NEGATIVE

## 2022-04-29 LAB — CBC
HCT: 42.3 % (ref 36.0–46.0)
Hemoglobin: 14.1 g/dL (ref 12.0–15.0)
MCH: 32.6 pg (ref 26.0–34.0)
MCHC: 33.3 g/dL (ref 30.0–36.0)
MCV: 97.9 fL (ref 80.0–100.0)
Platelets: 306 10*3/uL (ref 150–400)
RBC: 4.32 MIL/uL (ref 3.87–5.11)
RDW: 13.4 % (ref 11.5–15.5)
WBC: 7.7 10*3/uL (ref 4.0–10.5)
nRBC: 0 % (ref 0.0–0.2)

## 2022-04-29 LAB — SURGICAL PCR SCREEN
MRSA, PCR: NEGATIVE
Staphylococcus aureus: NEGATIVE

## 2022-04-29 LAB — SEDIMENTATION RATE: Sed Rate: 7 mm/hr (ref 0–30)

## 2022-04-29 LAB — C-REACTIVE PROTEIN: CRP: 0.5 mg/dL (ref <1.0)

## 2022-04-29 NOTE — Patient Instructions (Signed)
Your procedure is scheduled on: 05/09/22 Report to [place]. To find out your arrival time please call [phone] between 1PM - 3PM on 05/08/22.  Remember: Instructions that are not followed completely may result in serious medical risk, up to and including death, or upon the discretion of your surgeon and anesthesiologist your surgery may need to be rescheduled.     _X__ 1. Do not eat food after midnight the night before your procedure.                 No gum chewing or hard candies. You may drink clear liquids up to 2 hours                 before you are scheduled to arrive for your surgery- DO not drink clear                 liquids within 2 hours of the start of your surgery.                 Clear Liquids include:  water, apple juice without pulp, clear carbohydrate                 drink such as Clearfast or Gatorade, Black Coffee or Tea (Do not add                 anything to coffee or tea). Diabetics water only  Drink the G2 Gatoraid 2 hours before arriving for surgery  __X__2.  On the morning of surgery brush your teeth with toothpaste and water, you                 may rinse your mouth with mouthwash if you wish.  Do not swallow any              toothpaste of mouthwash.     _X__ 3.  No Alcohol for 24 hours before or after surgery.   _X__ 4.  Do Not Smoke or use e-cigarettes For 24 Hours Prior to Your Surgery.                 Do not use any chewable tobacco products for at least 6 hours prior to                 surgery.  ____  5.  Bring all medications with you on the day of surgery if instructed.   __X__  6.  Notify your doctor if there is any change in your medical condition      (cold, fever, infections).     Do not wear jewelry, make-up, hairpins, clips or nail or toenail polish. Do not wear lotions, powders, or perfumes. You may wear deodorant Do not shave body hair 48 hours prior to surgery. Men may shave face and  neck. Do not bring valuables to the hospital.    [facility] is not responsible for any belongings or valuables.  Contacts, dentures/partials or body piercings may not be worn into surgery. Bring a case for your contacts, glasses or hearing aids, a denture cup will be supplied. Leave your suitcase in the car. After surgery it may be brought to your room. For patients admitted to the hospital, discharge time is determined by your treatment team.   Patients discharged the day of surgery will not be allowed to drive home.   Please read over the following fact sheets that you were given:  MRSA Information, CHG soap, G2 gatorade, Incentive Spirometer  __X__ Take these medicines the morning of surgery with A SIP OF WATER:    1. cetirizine (ZYRTEC) 10 MG tablet  2. omeprazole (PRILOSEC) 40 MG capsule  3.   4.  5.  6.  ____ Fleet Enema (as directed)   __X__ Use CHG Soap/SAGE wipes as directed  ____ Use inhalers on the day of surgery  ____ Stop metformin/Janumet/Farxiga 2 days prior to surgery    __X__ Insulin Pump per Dr Honor Junes  ____ Stop Blood Thinners Coumadin/Plavix/Xarelto/Pleta/Pradaxa/Eliquis/Effient/Aspirin  on   Or contact your Surgeon, Cardiologist or Medical Doctor regarding  ability to stop your blood thinners  __X__ Stop Anti-inflammatories 7 days before surgery such as Advil, Ibuprofen, Motrin,  BC or Goodies Powder, Naprosyn, Naproxen, Aleve, Aspirin   __X__ Stop all herbals and supplements, fish oil or vitamins for 7 days until after surgery.    ____ Bring C-Pap to the hospital.

## 2022-04-29 NOTE — Pre-Procedure Instructions (Signed)
Per instructions from Diabetes Coordinator Jeanine patient is to follow up with her Endocrinologist regarding insulin pump management for upcoming knee surgery. Patient agreed and verbalized understanding.

## 2022-05-01 LAB — IGE: IgE (Immunoglobulin E), Serum: 100 IU/mL (ref 6–495)

## 2022-05-04 NOTE — H&P (Signed)
ORTHOPAEDIC HISTORY & PHYSICAL Cindy Sandoval, Cindy Sandoval., MD - 04/11/2022 10:30 AM EDT Formatting of this note is different from the original. Images from the original note were not included. Chief Complaint: Chief Complaint  Patient presents with  Left knee degenerative arthrosis   Reason for Visit: The patient is a 71 y.o. female who presents today for reevaluation of her left knee. She reports a long history of left knee pain. She underwent left knee arthroscopy, meniscectomy and chondral debridement in 2014 as per Dr. Marlou Starks ([facility]). She has appreciated increased left knee pain over the last several months. She localizes most of the pain along the lateral aspect of the knee. She reports some swelling, no locking, and some giving way of the knee. The pain is aggravated by any weight bearing. The knee pain limits the patient's ability to ambulate long distances.The patient has not appreciated any significant improvement despite Tylenol, activity modification, and viscosupplementation. She declined NSAIDs and intraarticular corticosteroid injections due to her diabetes. She is not using any ambulatory aids. The patient states that the knee pain has progressed to the point that it is significantly interfering with her activities of daily living.  Medications: Current Outpatient Medications  Medication Sig Dispense Refill  amLODIPine (NORVASC) 10 MG tablet TAKE 1 TABLET BY MOUTH EVERY DAY 90 tablet 2  aspirin 81 mg tablet 1 tab by mouth daily  diclofenac (VOLTAREN) 1 % topical gel Apply topically once daily as needed  losartan (COZAAR) 100 MG tablet TAKE 1 TABLET BY MOUTH EVERY DAY 90 tablet 3  mecobalamin (B12 ACTIVE ORAL) Take 25 mcg by mouth once daily  melatonin 1 mg/4 mL Drop Take 2 drops by mouth nightly as needed  omeprazole (PRILOSEC) 40 MG DR capsule TAKE 1 CAPSULE BY MOUTH EVERY DAY 90 capsule 1  pravastatin (PRAVACHOL) 20 MG tablet TAKE 1 TABLET BY MOUTH EVERY DAY 90 tablet 3  T:SLIM G4  INSULIN PUMP Misc by Continuous Sub-Q Infusn (via wearable injector) route as directed   No current facility-administered medications for this visit.   Allergies: Allergies  Allergen Reactions  Kiwi Anaphylaxis  Insulin Glargine Hives and Itching  Mupirocin Calcium Swelling  Used around knee and caused swelling  Adhesive Itching and Rash  Bactroban [Mupirocin] Swelling  Redness at site  Cephalexin Diarrhea and Abdominal Pain  Cramping, bloating  Dexlansoprazole Abdominal Pain  Bloating, did not help digestive issues  Latanoprost Other (See Comments)  Causes ocular discomfort and burning  Lisinopril Cough and Muscle Pain  Fatigue  Septra [Sulfamethoxazole-Trimethoprim] Nausea and Vomiting  Low sodium, dehydration with hospitalization  Simvastatin Muscle Pain   Past Medical History: Past Medical History:  Diagnosis Date  Adhesive capsulitis  Ankle fracture April 8th, 2016  Left foot  Baker's cyst of knee  left  Basal cell carcinoma  Cancer (CMS-HCC)  basal cell left eyelid  Chicken pox  COVID-19 09/2019  Depression  Diabetes mellitus type 1 (CMS-HCC)  On insulin pump therapy. Followed by Dr. Eddie Dibbles.  Diabetic retinopathy associated with type 2 diabetes mellitus (CMS-HCC)  Fibrocystic breast disease  GERD (gastroesophageal reflux disease)  Head injury December 01, 2014  Hyperlipidemia  Hypertension  Ocular hypertension  Osteoarthritis 05/19/2012  a. Extensive work-up for secondary causes negative. b. HCQ 4 1/2 months 2008, ineffective c. Doxycycline spring 2011 effective, stopped for side effects after 1 mo.  Osteoporosis  PCO (posterior capsular opacification), bilateral  Pseudophakia of both eyes  Recurrent herpes simplex  Redundant colon 10/20/2016  Shingles 2014  UTI (urinary  tract infection)   Past Surgical History: Past Surgical History:  Procedure Laterality Date  Basal cell removed 2001  PCIOL OU 2012  COLONOSCOPY 04/11/2011  MUS- NO POLYPS/FMHX CCA  REPEAT 5 YRS  FUNCTIONAL ENDOSCOPIC SINUS SURGERY 01/2012  ARTHROSCOPY KNEE W/DEBRIDEMENT/SHAVING ARTICULAR CARTILAGE Left 06/23/2013  Procedure: ARTHROSCOPY KNEE W/DEBRIDEMENT/SHAVING ARTICULAR CARTILAGE; Surgeon: Amador Cunas, MD; Location: [facility]; Service: Orthopedics; Laterality: Left;  UNLISTED ARTHROSCOPY PROCEDURE Left 06/23/2013  Procedure: Arthroscopic Baker's Cystectomy; Surgeon: Amador Cunas, MD; Location: [facility] OR; Service: Orthopedics; Laterality: Left;  ARTHROSCOPY KNEE W/SYNOVECTOMY Left 06/23/2013  Procedure: ARTHROSCOPY KNEE W/SYNOVECTOMY; Surgeon: Amador Cunas, MD; Location: [facility] OR; Service: Orthopedics; Laterality: Left;  ARTHROSCOPY KNEE W/MENISCECTOMY Left 06/23/2013  Procedure: ARTHROSCOPY, KNEE, SURGICAL; WITH MENISCECTOMY (MEDIAL OR LATERAL, INCLUDING ANY MENISCAL SHAVING) INCLUDING DEBRIDEMENT/SHAVING ARTICULAR CARTILAGE, SAME OR SEPARATE COMPARTMENT, WHEN PERFORMED; Surgeon: Amador Cunas, MD; Location: [facility] OR; Service: Orthopedics; Laterality: Left;  UPPER GASTROINTESTINAL ENDOSCOPY 12/12/2013  MUS  EGD 12/12/2013  MUS @ [facility] - no rpt per MUS  COLONOSCOPY 10/20/2016  Redundant colon/ Entire examined colon is normal/FHx colon cancer-Sister/Repeat 68yr/MUS  Right total knee arthroplasty using computer-assisted navigation 09/06/2018  Dr HMarry Guan BREAST SURGERY Left  biospy  CATARACT EXTRACTION  EYELID SURGERY Bilateral  Blepharoplasty  EYELID SURGERY Right  Basal cell carcinoma  GLAUCOMA EYE SURGERY Bilateral  SLT  LENS EYE SURGERY Bilateral  CEIOL  VITREOUS RETINAL SURGERY Right  PRP   Social History: Social History   Socioeconomic History  Marital status: Married  Spouse name: Jimmy  Number of children: 1  Years of education: GED  Highest education level: GED or equivalent  Occupational History  Occupation: Retired  Tobacco Use  Smoking status: Never  Smokeless tobacco: Never  Vaping Use  Vaping Use: Never used   Substance and Sexual Activity  Alcohol use: Yes  Alcohol/week: 14.0 standard drinks  Types: 14 Glasses of wine per week  Drug use: No  Sexual activity: Not Currently  Partners: Male  Birth control/protection: None   Family History: Family History  Problem Relation Age of Onset  Heart disease Mother  Diabetes type II Mother  High blood pressure (Hypertension) Mother  Stroke Mother  Diabetes type II Father  Heart disease Father  Myocardial Infarction (Heart attack) Father  High blood pressure (Hypertension) Father  Colon cancer Sister  Diabetes type II Sister  Dementia Sister  No Known Problems Sister  Diabetes type II Brother  Colon cancer Brother  Brain cancer Brother  Cancer Brother  Unknown type  Heart disease Daughter  Heart disease Maternal Grandfather  High blood pressure (Hypertension) Maternal Grandfather  Diabetes type II Maternal Grandfather  Anesthesia problems Neg Hx  Malignant hyperthermia Neg Hx  Hypothyroidism Neg Hx  Glaucoma Neg Hx  Macular degeneration Neg Hx   Review of Systems: A comprehensive 14 point ROS was performed, reviewed, and the pertinent orthopaedic findings are documented in the HPI.  Exam BP 110/68  Ht 154.9 cm ('5\' 1"'$ )  Wt 85.2 kg (187 lb 12.8 oz)  LMP (LMP Unknown)  BMI 35.48 kg/m   General:  Well-developed, well-nourished female seen in no acute distress.  Antalgic gait. Valgus thrust to the left knee.  HEENT:  Atraumatic, normocephalic. Pupils are equal and reactive to light. Extraocular motion is intact. Sclera are clear. Oropharynx is clear with moist mucosa.  Neck:  Supple, nontender, and with good ROM. No thyromegaly, adenopathy, JVD, or carotid bruits.  Lungs:  Clear to auscultation bilaterally.  Cardiovascular:  Regular rate and  rhythm. Normal S1, S2. No murmur. No appreciable gallops or rubs. Peripheral pulses are palpable. No lower extremity edema. Homan`s test is negative.  Abdomen:  Soft, nontender,  nondistended. Bowel sounds are present.  Extremities: Good strength, stability, and range of motion of the upper extremities. Good range of motion of the hips and ankles.  Left Knee: Soft tissue swelling: mild Effusion: none Erythema: none Crepitance: mild Tenderness: lateral Alignment: relative valgus Mediolateral laxity: lateral pseudolaxity Posterior sag: negative Patellar tracking: Good tracking without evidence of subluxation or tilt Atrophy: No significant atrophy.  Quadriceps tone was good. Range of motion: 0/0/100 degrees  Neurologic:  Awake, alert, and oriented.  Sensory function is intact to pinprick and light touch.  Motor strength is judged to be 5/5.  Motor coordination is within normal limits.  No apparent clonus. No tremor.   X-rays: I ordered and interpreted standing AP, lateral, and sunrise radiographs of the left knee that were obtained in the office today. There is significant narrowing of the lateral cartilage space with associated valgus alignment. Osteophyte formation is noted. Subchondral sclerosis is noted. No evidence of fracture or dislocation.  Impression: Degenerative arthrosis of the left knee  Plan:  The findings were discussed in detail with the patient. The patient was given informational material on total knee replacement. Conservative treatment options were reviewed with the patient. We discussed the risks and benefits of surgical intervention. The usual perioperative course was also discussed in detail. The patient expressed understanding of the risks and benefits of surgical intervention and would like to proceed with plans for left total knee arthroplasty.  Hemoglobin A1c is an indication of glucose control. Uncontrolled diabetes has been associated with perioperative complications including poor wound healing and surgical site infections. To decrease the risk of perioperative complications, hemoglobin A1c must be less than 8.0 prior to  surgery. The patient is encouraged to work with her primary care physician and/or endocrinologist to optimize glucose management.  Lab Results  Component Value Date  HGBA1C 6.1 (H) 03/07/2022  HGBA1C 6.2 (H) 10/25/2021   I spent a total of 40 minutes in both face-to-face and non-face-to-face activities, excluding procedures performed, for this visit on the date of this encounter.  MEDICAL CLEARANCE: Per anesthesiology. ACTIVITY: As tolerated. WORK STATUS: Not applicable. THERAPY: Preoperative physical therapy evaluation. MEDICATIONS: Requested Prescriptions   No prescriptions requested or ordered in this encounter   FOLLOW-UP: Return for preop History & Physical pending surgery date.  Karlene Southard P. Holley Bouche., M.D.  This note was generated in part with voice recognition software and I apologize for any typographical errors that were not detected and corrected.  Electronically signed by Lamar Benes., MD at 04/13/2022 7:41 PM EDT

## 2022-05-08 MED ORDER — SODIUM CHLORIDE 0.9 % IV SOLN: INTRAVENOUS | Status: DC

## 2022-05-08 MED ORDER — DEXAMETHASONE SODIUM PHOSPHATE 10 MG/ML IJ SOLN: 8.0000 mg | Freq: Once | INTRAMUSCULAR | Status: AC

## 2022-05-08 MED ORDER — CHLORHEXIDINE GLUCONATE 4 % EX LIQD: 60.0000 mL | Freq: Once | CUTANEOUS | Status: DC

## 2022-05-08 MED ORDER — CEFAZOLIN SODIUM-DEXTROSE 2-4 GM/100ML-% IV SOLN
2.0000 g | INTRAVENOUS | Status: AC
  Administered 2022-05-09: 2 g via INTRAVENOUS

## 2022-05-08 MED ORDER — CHLORHEXIDINE GLUCONATE 0.12 % MT SOLN: 15.0000 mL | Freq: Once | OROMUCOSAL | Status: AC

## 2022-05-08 MED ORDER — TRANEXAMIC ACID-NACL 1000-0.7 MG/100ML-% IV SOLN
1000.0000 mg | INTRAVENOUS | Status: AC
  Administered 2022-05-09: 1000 mg via INTRAVENOUS

## 2022-05-08 MED ORDER — GABAPENTIN 300 MG PO CAPS: 300.0000 mg | ORAL_CAPSULE | Freq: Once | ORAL | Status: AC

## 2022-05-08 MED ORDER — ORAL CARE MOUTH RINSE: 15.0000 mL | Freq: Once | OROMUCOSAL | Status: AC

## 2022-05-08 MED ORDER — CELECOXIB 200 MG PO CAPS: 400.0000 mg | ORAL_CAPSULE | Freq: Once | ORAL | Status: AC

## 2022-05-09 ENCOUNTER — Ambulatory Visit: Payer: Medicare Other | Admitting: Urgent Care

## 2022-05-09 ENCOUNTER — Observation Stay: Payer: Medicare Other

## 2022-05-09 ENCOUNTER — Observation Stay
Admission: RE | Admit: 2022-05-09 | Discharge: 2022-05-10 | Disposition: A | Discharge status: Home Health | Payer: Medicare Other | Attending: Orthopedic Surgery | Admitting: Orthopedic Surgery

## 2022-05-09 ENCOUNTER — Encounter: Payer: Self-pay | Admitting: Orthopedic Surgery

## 2022-05-09 ENCOUNTER — Other Ambulatory Visit: Payer: Self-pay

## 2022-05-09 ENCOUNTER — Encounter
Admission: RE | Disposition: A | Discharge status: Home Health | Payer: Self-pay | Source: Home / Self Care | Attending: Orthopedic Surgery

## 2022-05-09 DIAGNOSIS — Z79899 Other long term (current) drug therapy: Secondary | ICD-10-CM | POA: Insufficient documentation

## 2022-05-09 DIAGNOSIS — Z8616 Personal history of COVID-19: Secondary | ICD-10-CM | POA: Insufficient documentation

## 2022-05-09 DIAGNOSIS — Z96659 Presence of unspecified artificial knee joint: Secondary | ICD-10-CM

## 2022-05-09 DIAGNOSIS — J45909 Unspecified asthma, uncomplicated: Secondary | ICD-10-CM | POA: Insufficient documentation

## 2022-05-09 DIAGNOSIS — E1029 Type 1 diabetes mellitus with other diabetic kidney complication: Secondary | ICD-10-CM

## 2022-05-09 DIAGNOSIS — Z7982 Long term (current) use of aspirin: Secondary | ICD-10-CM | POA: Insufficient documentation

## 2022-05-09 DIAGNOSIS — M1712 Unilateral primary osteoarthritis, left knee: Principal | ICD-10-CM | POA: Insufficient documentation

## 2022-05-09 DIAGNOSIS — Z881 Allergy status to other antibiotic agents status: Secondary | ICD-10-CM

## 2022-05-09 DIAGNOSIS — Z85828 Personal history of other malignant neoplasm of skin: Secondary | ICD-10-CM | POA: Diagnosis not present

## 2022-05-09 DIAGNOSIS — Z794 Long term (current) use of insulin: Secondary | ICD-10-CM | POA: Insufficient documentation

## 2022-05-09 DIAGNOSIS — E103593 Type 1 diabetes mellitus with proliferative diabetic retinopathy without macular edema, bilateral: Secondary | ICD-10-CM

## 2022-05-09 DIAGNOSIS — R809 Proteinuria, unspecified: Secondary | ICD-10-CM

## 2022-05-09 DIAGNOSIS — E109 Type 1 diabetes mellitus without complications: Secondary | ICD-10-CM | POA: Insufficient documentation

## 2022-05-09 HISTORY — PX: KNEE ARTHROPLASTY: SHX992

## 2022-05-09 LAB — GLUCOSE, CAPILLARY
Glucose-Capillary: 150 mg/dL — ABNORMAL HIGH (ref 70–99)
Glucose-Capillary: 185 mg/dL — ABNORMAL HIGH (ref 70–99)
Glucose-Capillary: 222 mg/dL — ABNORMAL HIGH (ref 70–99)
Glucose-Capillary: 201 mg/dL — ABNORMAL HIGH (ref 70–99)
Glucose-Capillary: 252 mg/dL — ABNORMAL HIGH (ref 70–99)

## 2022-05-09 SURGERY — ARTHROPLASTY, KNEE, TOTAL, USING IMAGELESS COMPUTER-ASSISTED NAVIGATION
Anesthesia: Spinal | Site: Knee | Laterality: Left

## 2022-05-09 MED ORDER — PROPOFOL 1000 MG/100ML IV EMUL
INTRAVENOUS | Status: AC
  Filled 2022-05-09: qty 100

## 2022-05-09 MED ORDER — GABAPENTIN 300 MG PO CAPS
ORAL_CAPSULE | ORAL | Status: AC
  Administered 2022-05-09: 300 mg via ORAL
  Filled 2022-05-09: qty 1

## 2022-05-09 MED ORDER — LOSARTAN POTASSIUM 50 MG PO TABS
100.0000 mg | ORAL_TABLET | Freq: Every day | ORAL | Status: DC
  Administered 2022-05-10: 100 mg via ORAL
  Filled 2022-05-09: qty 2

## 2022-05-09 MED ORDER — BISACODYL 10 MG RE SUPP: 10.0000 mg | Freq: Every day | RECTAL | Status: DC | PRN

## 2022-05-09 MED ORDER — BUPIVACAINE LIPOSOME 1.3 % IJ SUSP
INTRAMUSCULAR | Status: AC
  Filled 2022-05-09: qty 20

## 2022-05-09 MED ORDER — CELECOXIB 200 MG PO CAPS
ORAL_CAPSULE | ORAL | Status: AC
  Administered 2022-05-09: 400 mg via ORAL
  Filled 2022-05-09: qty 2

## 2022-05-09 MED ORDER — VITAMIN B-12 1000 MCG PO TABS
2500.0000 ug | ORAL_TABLET | Freq: Every day | ORAL | Status: DC
  Administered 2022-05-10: 2500 ug via ORAL
  Filled 2022-05-09: qty 3

## 2022-05-09 MED ORDER — METOCLOPRAMIDE HCL 5 MG PO TABS
10.0000 mg | ORAL_TABLET | Freq: Three times a day (TID) | ORAL | Status: DC
  Administered 2022-05-09 – 2022-05-10 (×2): 10 mg via ORAL
  Filled 2022-05-09 (×3): qty 2

## 2022-05-09 MED ORDER — MENTHOL 3 MG MT LOZG
1.0000 | LOZENGE | OROMUCOSAL | Status: DC | PRN
  Filled 2022-05-09: qty 9

## 2022-05-09 MED ORDER — MIDAZOLAM HCL 2 MG/2ML IJ SOLN
INTRAMUSCULAR | Status: AC
  Filled 2022-05-09: qty 2

## 2022-05-09 MED ORDER — PHENOL 1.4 % MT LIQD
1.0000 | OROMUCOSAL | Status: DC | PRN
  Filled 2022-05-09 (×2): qty 177

## 2022-05-09 MED ORDER — CELECOXIB 200 MG PO CAPS
200.0000 mg | ORAL_CAPSULE | Freq: Two times a day (BID) | ORAL | Status: DC
  Administered 2022-05-09 – 2022-05-10 (×3): 200 mg via ORAL
  Filled 2022-05-09 (×3): qty 1

## 2022-05-09 MED ORDER — EPHEDRINE SULFATE (PRESSORS) 50 MG/ML IJ SOLN
INTRAMUSCULAR | Status: DC | PRN
  Administered 2022-05-09: 10 mg via INTRAVENOUS

## 2022-05-09 MED ORDER — MIDAZOLAM HCL 5 MG/5ML IJ SOLN
INTRAMUSCULAR | Status: DC | PRN
  Administered 2022-05-09 (×2): 1 mg via INTRAVENOUS

## 2022-05-09 MED ORDER — GLYCOPYRROLATE 0.2 MG/ML IJ SOLN
INTRAMUSCULAR | Status: DC | PRN
  Administered 2022-05-09 (×2): .1 mg via INTRAVENOUS

## 2022-05-09 MED ORDER — FENTANYL CITRATE (PF) 100 MCG/2ML IJ SOLN
25.0000 ug | INTRAMUSCULAR | Status: DC | PRN
  Administered 2022-05-09: 25 ug via INTRAVENOUS

## 2022-05-09 MED ORDER — AMLODIPINE BESYLATE 10 MG PO TABS
10.0000 mg | ORAL_TABLET | Freq: Every day | ORAL | Status: DC
  Administered 2022-05-10: 10 mg via ORAL
  Filled 2022-05-09: qty 1

## 2022-05-09 MED ORDER — SODIUM CHLORIDE (PF) 0.9 % IJ SOLN
INTRAMUSCULAR | Status: DC | PRN
  Administered 2022-05-09: 120 mL via INTRAMUSCULAR

## 2022-05-09 MED ORDER — TRAMADOL HCL 50 MG PO TABS: 50.0000 mg | ORAL_TABLET | ORAL | Status: DC | PRN

## 2022-05-09 MED ORDER — SODIUM CHLORIDE 0.9 % IR SOLN
Status: DC | PRN
  Administered 2022-05-09: 3000 mL

## 2022-05-09 MED ORDER — PHENYLEPHRINE HCL-NACL 20-0.9 MG/250ML-% IV SOLN
INTRAVENOUS | Status: DC | PRN
  Administered 2022-05-09: 25 ug/min via INTRAVENOUS

## 2022-05-09 MED ORDER — INSULIN ASPART 100 UNIT/ML IJ SOLN
5.0000 [IU] | Freq: Once | INTRAMUSCULAR | Status: AC
  Administered 2022-05-09: 5 [IU] via SUBCUTANEOUS

## 2022-05-09 MED ORDER — FERROUS SULFATE 325 (65 FE) MG PO TABS
325.0000 mg | ORAL_TABLET | Freq: Two times a day (BID) | ORAL | Status: DC
  Administered 2022-05-09 – 2022-05-10 (×2): 325 mg via ORAL
  Filled 2022-05-09 (×2): qty 1

## 2022-05-09 MED ORDER — OXYCODONE HCL 5 MG PO TABS
5.0000 mg | ORAL_TABLET | ORAL | Status: DC | PRN
  Administered 2022-05-09 – 2022-05-10 (×2): 5 mg via ORAL
  Filled 2022-05-09: qty 1

## 2022-05-09 MED ORDER — LORATADINE 10 MG PO TABS
10.0000 mg | ORAL_TABLET | Freq: Every day | ORAL | Status: DC
  Administered 2022-05-10: 10 mg via ORAL
  Filled 2022-05-09: qty 1

## 2022-05-09 MED ORDER — PANTOPRAZOLE SODIUM 40 MG PO TBEC
40.0000 mg | DELAYED_RELEASE_TABLET | Freq: Two times a day (BID) | ORAL | Status: DC
  Administered 2022-05-09 – 2022-05-10 (×2): 40 mg via ORAL
  Filled 2022-05-09 (×2): qty 1

## 2022-05-09 MED ORDER — DIPHENHYDRAMINE HCL 12.5 MG/5ML PO ELIX: 12.5000 mg | ORAL_SOLUTION | ORAL | Status: DC | PRN

## 2022-05-09 MED ORDER — BUPIVACAINE HCL (PF) 0.5 % IJ SOLN
INTRAMUSCULAR | Status: DC | PRN
  Administered 2022-05-09: 3 mL via INTRATHECAL

## 2022-05-09 MED ORDER — FLEET ENEMA 7-19 GM/118ML RE ENEM: 1.0000 | ENEMA | Freq: Once | RECTAL | Status: DC | PRN

## 2022-05-09 MED ORDER — CHLORHEXIDINE GLUCONATE 0.12 % MT SOLN
OROMUCOSAL | Status: AC
  Administered 2022-05-09: 15 mL via OROMUCOSAL
  Filled 2022-05-09: qty 15

## 2022-05-09 MED ORDER — ACETAMINOPHEN 10 MG/ML IV SOLN
INTRAVENOUS | Status: AC
  Filled 2022-05-09: qty 100

## 2022-05-09 MED ORDER — TRANEXAMIC ACID-NACL 1000-0.7 MG/100ML-% IV SOLN
1000.0000 mg | Freq: Once | INTRAVENOUS | Status: AC
  Administered 2022-05-09: 1000 mg via INTRAVENOUS

## 2022-05-09 MED ORDER — PROPOFOL 10 MG/ML IV BOLUS
INTRAVENOUS | Status: DC | PRN
  Administered 2022-05-09: 30 mg via INTRAVENOUS
  Administered 2022-05-09: 50 mg via INTRAVENOUS
  Administered 2022-05-09 (×2): 20 mg via INTRAVENOUS

## 2022-05-09 MED ORDER — PROPOFOL 500 MG/50ML IV EMUL
INTRAVENOUS | Status: DC | PRN
  Administered 2022-05-09: 100 ug/kg/min via INTRAVENOUS

## 2022-05-09 MED ORDER — INSULIN ASPART 100 UNIT/ML IJ SOLN
INTRAMUSCULAR | Status: AC
  Filled 2022-05-09: qty 1

## 2022-05-09 MED ORDER — ACETAMINOPHEN 10 MG/ML IV SOLN
INTRAVENOUS | Status: DC | PRN
  Administered 2022-05-09: 1000 mg via INTRAVENOUS

## 2022-05-09 MED ORDER — BUPIVACAINE HCL (PF) 0.25 % IJ SOLN
INTRAMUSCULAR | Status: AC
  Filled 2022-05-09: qty 60

## 2022-05-09 MED ORDER — ONDANSETRON HCL 4 MG/2ML IJ SOLN: 4.0000 mg | Freq: Four times a day (QID) | INTRAMUSCULAR | Status: DC | PRN

## 2022-05-09 MED ORDER — AZELASTINE-FLUTICASONE 137-50 MCG/ACT NA SUSP: 1.0000 | Freq: Every day | NASAL | Status: DC | PRN

## 2022-05-09 MED ORDER — ENOXAPARIN SODIUM 30 MG/0.3ML IJ SOSY
30.0000 mg | PREFILLED_SYRINGE | Freq: Two times a day (BID) | INTRAMUSCULAR | Status: DC
  Administered 2022-05-10: 30 mg via SUBCUTANEOUS
  Filled 2022-05-09: qty 0.3

## 2022-05-09 MED ORDER — TRANEXAMIC ACID-NACL 1000-0.7 MG/100ML-% IV SOLN
INTRAVENOUS | Status: AC
  Filled 2022-05-09: qty 100

## 2022-05-09 MED ORDER — BUPIVACAINE HCL (PF) 0.5 % IJ SOLN
INTRAMUSCULAR | Status: AC
  Filled 2022-05-09: qty 10

## 2022-05-09 MED ORDER — ACETAMINOPHEN 325 MG PO TABS: 325.0000 mg | ORAL_TABLET | Freq: Four times a day (QID) | ORAL | Status: DC | PRN

## 2022-05-09 MED ORDER — FENTANYL CITRATE (PF) 100 MCG/2ML IJ SOLN
INTRAMUSCULAR | Status: AC
  Administered 2022-05-09: 25 ug via INTRAVENOUS
  Filled 2022-05-09: qty 2

## 2022-05-09 MED ORDER — HYDROMORPHONE HCL 1 MG/ML IJ SOLN: 0.5000 mg | INTRAMUSCULAR | Status: DC | PRN

## 2022-05-09 MED ORDER — ACETAMINOPHEN 10 MG/ML IV SOLN
1000.0000 mg | Freq: Four times a day (QID) | INTRAVENOUS | Status: DC
  Administered 2022-05-09 – 2022-05-10 (×3): 1000 mg via INTRAVENOUS
  Filled 2022-05-09 (×3): qty 100

## 2022-05-09 MED ORDER — SODIUM CHLORIDE FLUSH 0.9 % IV SOLN
INTRAVENOUS | Status: AC
  Filled 2022-05-09: qty 40

## 2022-05-09 MED ORDER — OXYCODONE HCL 5 MG PO TABS
10.0000 mg | ORAL_TABLET | ORAL | Status: DC | PRN
  Filled 2022-05-09: qty 2

## 2022-05-09 MED ORDER — CEFAZOLIN SODIUM-DEXTROSE 2-4 GM/100ML-% IV SOLN
INTRAVENOUS | Status: AC
  Filled 2022-05-09: qty 100

## 2022-05-09 MED ORDER — GLYCOPYRROLATE 0.2 MG/ML IJ SOLN
INTRAMUSCULAR | Status: AC
  Filled 2022-05-09: qty 1

## 2022-05-09 MED ORDER — CEFAZOLIN SODIUM-DEXTROSE 2-4 GM/100ML-% IV SOLN
2.0000 g | Freq: Four times a day (QID) | INTRAVENOUS | Status: AC
  Administered 2022-05-09 (×2): 2 g via INTRAVENOUS
  Filled 2022-05-09 (×2): qty 100

## 2022-05-09 MED ORDER — OXYCODONE HCL 5 MG PO TABS
ORAL_TABLET | ORAL | Status: AC
  Administered 2022-05-09: 5 mg via ORAL
  Filled 2022-05-09: qty 1

## 2022-05-09 MED ORDER — SURGIPHOR WOUND IRRIGATION SYSTEM - OPTIME
TOPICAL | Status: DC | PRN
  Administered 2022-05-09: 1

## 2022-05-09 MED ORDER — OXYCODONE HCL 5 MG/5ML PO SOLN: 5.0000 mg | Freq: Once | ORAL | Status: AC | PRN

## 2022-05-09 MED ORDER — SODIUM CHLORIDE 0.9 % IV SOLN: INTRAVENOUS | Status: DC

## 2022-05-09 MED ORDER — MAGNESIUM HYDROXIDE 400 MG/5ML PO SUSP
30.0000 mL | Freq: Every day | ORAL | Status: DC
  Administered 2022-05-10: 30 mL via ORAL
  Filled 2022-05-09: qty 30

## 2022-05-09 MED ORDER — PHENYLEPHRINE HCL-NACL 20-0.9 MG/250ML-% IV SOLN
INTRAVENOUS | Status: AC
  Filled 2022-05-09: qty 250

## 2022-05-09 MED ORDER — DEXAMETHASONE SODIUM PHOSPHATE 10 MG/ML IJ SOLN
INTRAMUSCULAR | Status: AC
  Administered 2022-05-09: 8 mg via INTRAVENOUS
  Filled 2022-05-09: qty 1

## 2022-05-09 MED ORDER — ONDANSETRON HCL 4 MG PO TABS: 4.0000 mg | ORAL_TABLET | Freq: Four times a day (QID) | ORAL | Status: DC | PRN

## 2022-05-09 MED ORDER — PRAVASTATIN SODIUM 20 MG PO TABS
20.0000 mg | ORAL_TABLET | Freq: Every day | ORAL | Status: DC
  Administered 2022-05-09: 20 mg via ORAL
  Filled 2022-05-09: qty 1

## 2022-05-09 MED ORDER — INSULIN PUMP
Freq: Three times a day (TID) | SUBCUTANEOUS | Status: DC
  Administered 2022-05-10: 0.1 via SUBCUTANEOUS
  Filled 2022-05-09: qty 1

## 2022-05-09 MED ORDER — KETAMINE HCL 50 MG/5ML IJ SOSY
PREFILLED_SYRINGE | INTRAMUSCULAR | Status: AC
  Filled 2022-05-09: qty 5

## 2022-05-09 MED ORDER — 0.9 % SODIUM CHLORIDE (POUR BTL) OPTIME
TOPICAL | Status: DC | PRN
  Administered 2022-05-09: 500 mL

## 2022-05-09 MED ORDER — OXYCODONE HCL 5 MG PO TABS: 5.0000 mg | ORAL_TABLET | Freq: Once | ORAL | Status: AC | PRN

## 2022-05-09 MED ORDER — EPHEDRINE 5 MG/ML INJ
INTRAVENOUS | Status: AC
  Filled 2022-05-09: qty 5

## 2022-05-09 MED ORDER — KETAMINE HCL 10 MG/ML IJ SOLN
INTRAMUSCULAR | Status: DC | PRN
  Administered 2022-05-09: 10 mg via INTRAVENOUS
  Administered 2022-05-09: 20 mg via INTRAVENOUS
  Administered 2022-05-09 (×2): 10 mg via INTRAVENOUS

## 2022-05-09 MED ORDER — INSULIN ASPART 100 UNIT/ML IJ SOLN: 0.0000 [IU] | Freq: Three times a day (TID) | INTRAMUSCULAR | Status: DC

## 2022-05-09 MED ORDER — SENNOSIDES-DOCUSATE SODIUM 8.6-50 MG PO TABS
1.0000 | ORAL_TABLET | Freq: Two times a day (BID) | ORAL | Status: DC
  Administered 2022-05-09 – 2022-05-10 (×2): 1 via ORAL
  Filled 2022-05-09 (×2): qty 1

## 2022-05-09 MED ORDER — ALUM & MAG HYDROXIDE-SIMETH 200-200-20 MG/5ML PO SUSP: 30.0000 mL | ORAL | Status: DC | PRN

## 2022-05-09 MED ORDER — INSULIN ASPART 100 UNIT/ML IJ SOLN: 0.0000 [IU] | Freq: Every day | INTRAMUSCULAR | Status: DC

## 2022-05-09 SURGICAL SUPPLY — 74 items
ATTUNE PS FEM LT SZ 3 CEM KNEE (Femur) IMPLANT
ATTUNE PSRP INSR SZ3 8 KNEE (Insert) IMPLANT
BASEPLATE TIBIAL ROTATING SZ 4 (Knees) IMPLANT
BATTERY INSTRU NAVIGATION (MISCELLANEOUS) ×4 IMPLANT
BLADE CLIPPER SURG (BLADE) IMPLANT
BLADE SAW 70X12.5 (BLADE) ×1 IMPLANT
BLADE SAW 90X13X1.19 OSCILLAT (BLADE) ×1 IMPLANT
BLADE SAW 90X25X1.19 OSCILLAT (BLADE) ×1 IMPLANT
BONE CEMENT GENTAMICIN (Cement) ×2 IMPLANT
CEMENT BONE GENTAMICIN 40 (Cement) IMPLANT
COOLER POLAR GLACIER W/PUMP (MISCELLANEOUS) ×1 IMPLANT
CUFF TOURN SGL QUICK 24 (TOURNIQUET CUFF)
CUFF TOURN SGL QUICK 34 (TOURNIQUET CUFF)
CUFF TRNQT CYL 24X4X16.5-23 (TOURNIQUET CUFF) IMPLANT
CUFF TRNQT CYL 34X4.125X (TOURNIQUET CUFF) IMPLANT
DRAPE 3/4 80X56 (DRAPES) ×1 IMPLANT
DRAPE INCISE IOBAN 66X45 STRL (DRAPES) IMPLANT
DRSG DERMACEA NONADH 3X8 (GAUZE/BANDAGES/DRESSINGS) ×1 IMPLANT
DRSG MEPILEX SACRM 8.7X9.8 (GAUZE/BANDAGES/DRESSINGS) ×1 IMPLANT
DRSG OPSITE POSTOP 4X14 (GAUZE/BANDAGES/DRESSINGS) ×1 IMPLANT
DRSG TEGADERM 4X4.75 (GAUZE/BANDAGES/DRESSINGS) ×1 IMPLANT
DURAPREP 26ML APPLICATOR (WOUND CARE) ×2 IMPLANT
ELECT CAUTERY BLADE 6.4 (BLADE) ×1 IMPLANT
ELECT REM PT RETURN 9FT ADLT (ELECTROSURGICAL) ×1
ELECTRODE REM PT RTRN 9FT ADLT (ELECTROSURGICAL) ×1 IMPLANT
EX-PIN ORTHOLOCK NAV 4X150 (PIN) ×2 IMPLANT
GLOVE BIO SURGEON STRL SZ7 (GLOVE) ×2 IMPLANT
GLOVE BIOGEL M STRL SZ7.5 (GLOVE) ×2 IMPLANT
GLOVE BIOGEL PI IND STRL 8 (GLOVE) ×1 IMPLANT
GLOVE PI ORTHO PRO STRL 7.5 (GLOVE) ×2 IMPLANT
GLOVE SURG UNDER POLY LF SZ7.5 (GLOVE) ×1 IMPLANT
GOWN STRL REUS W/ TWL LRG LVL3 (GOWN DISPOSABLE) ×2 IMPLANT
GOWN STRL REUS W/ TWL XL LVL3 (GOWN DISPOSABLE) ×1 IMPLANT
GOWN STRL REUS W/TWL LRG LVL3 (GOWN DISPOSABLE) ×2
GOWN STRL REUS W/TWL XL LVL3 (GOWN DISPOSABLE) ×1
HEMOVAC 400CC 10FR (MISCELLANEOUS) ×1 IMPLANT
HOLDER FOLEY CATH W/STRAP (MISCELLANEOUS) ×1 IMPLANT
HOLSTER ELECTROSUGICAL PENCIL (MISCELLANEOUS) ×1 IMPLANT
HOOD PEEL AWAY FLYTE STAYCOOL (MISCELLANEOUS) ×2 IMPLANT
IV NS IRRIG 3000ML ARTHROMATIC (IV SOLUTION) ×1 IMPLANT
KIT TURNOVER KIT A (KITS) ×1 IMPLANT
KNIFE SCULPS 14X20 (INSTRUMENTS) ×1 IMPLANT
MANIFOLD NEPTUNE II (INSTRUMENTS) ×2 IMPLANT
NDL SPNL 20GX3.5 QUINCKE YW (NEEDLE) ×2 IMPLANT
NEEDLE SPNL 20GX3.5 QUINCKE YW (NEEDLE) ×2 IMPLANT
NS IRRIG 500ML POUR BTL (IV SOLUTION) ×1 IMPLANT
PACK TOTAL KNEE (MISCELLANEOUS) ×1 IMPLANT
PAD ABD DERMACEA PRESS 5X9 (GAUZE/BANDAGES/DRESSINGS) ×2 IMPLANT
PAD WRAPON POLAR KNEE (MISCELLANEOUS) ×1 IMPLANT
PATELLA MEDIAL ATTUN 35MM KNEE (Knees) IMPLANT
PIN DRILL FIX HALF THREAD (BIT) ×2 IMPLANT
PIN FIXATION 1/8DIA X 3INL (PIN) ×1 IMPLANT
PULSAVAC PLUS IRRIG FAN TIP (DISPOSABLE) ×1
SOL PREP PVP 2OZ (MISCELLANEOUS) ×1
SOLUTION IRRIG SURGIPHOR (IV SOLUTION) ×1 IMPLANT
SOLUTION PREP PVP 2OZ (MISCELLANEOUS) ×1 IMPLANT
SPONGE DRAIN TRACH 4X4 STRL 2S (GAUZE/BANDAGES/DRESSINGS) ×1 IMPLANT
STAPLER SKIN PROX 35W (STAPLE) ×1 IMPLANT
STOCKINETTE IMPERV 14X48 (MISCELLANEOUS) IMPLANT
STRAP TIBIA SHORT (MISCELLANEOUS) ×1 IMPLANT
SUCTION FRAZIER HANDLE 10FR (MISCELLANEOUS) ×1
SUCTION TUBE FRAZIER 10FR DISP (MISCELLANEOUS) ×1 IMPLANT
SUT VIC AB 0 CT1 36 (SUTURE) ×2 IMPLANT
SUT VIC AB 1 CT1 36 (SUTURE) ×2 IMPLANT
SUT VIC AB 2-0 CT2 27 (SUTURE) ×1 IMPLANT
SYR 30ML LL (SYRINGE) ×2 IMPLANT
TIP FAN IRRIG PULSAVAC PLUS (DISPOSABLE) ×1 IMPLANT
TOWEL OR 17X26 4PK STRL BLUE (TOWEL DISPOSABLE) IMPLANT
TOWER CARTRIDGE SMART MIX (DISPOSABLE) ×1 IMPLANT
TRAP FLUID SMOKE EVACUATOR (MISCELLANEOUS) ×1 IMPLANT
TRAY FOLEY MTR SLVR 16FR STAT (SET/KITS/TRAYS/PACK) ×1 IMPLANT
WATER STERILE IRR 1000ML POUR (IV SOLUTION) IMPLANT
WATER STERILE IRR 500ML POUR (IV SOLUTION) ×1 IMPLANT
WRAPON POLAR PAD KNEE (MISCELLANEOUS) ×1

## 2022-05-09 NOTE — Anesthesia Procedure Notes (Signed)
Spinal  Patient location during procedure: OR Start time: 05/09/2022 7:30 AM End time: 05/09/2022 7:37 AM Reason for block: surgical anesthesia Staffing Performed: resident/CRNA  Resident/CRNA: Lia Foyer, CRNA Performed by: Lia Foyer, CRNA Authorized by: Andria Frames, MD   Preanesthetic Checklist Completed: patient identified, IV checked, site marked, risks and benefits discussed, surgical consent, monitors and equipment checked, pre-op evaluation and timeout performed Spinal Block Patient position: sitting Prep: ChloraPrep Patient monitoring: heart rate, cardiac monitor, continuous pulse ox and blood pressure Approach: midline Location: L3-4 Injection technique: single-shot Needle Needle type: Pencan  Needle gauge: 25 G Needle length: 9 cm Assessment Sensory level: T4 Events: CSF return

## 2022-05-09 NOTE — Transfer of Care (Signed)
Immediate Anesthesia Transfer of Care Note  Patient: Cindy Sandoval  Procedure(s) Performed: COMPUTER ASSISTED TOTAL KNEE ARTHROPLASTY (Left: Knee)  Patient Location: PACU  Anesthesia Type:General and Spinal  Level of Consciousness: drowsy  Airway & Oxygen Therapy: Patient Spontanous Breathing and Patient connected to face mask oxygen  Post-op Assessment: Report given to RN and Post -op Vital signs reviewed and stable  Post vital signs: Reviewed and stable  Last Vitals:  Vitals Value Taken Time  BP 108/58 05/09/22 1109  Temp 36.9 C 05/09/22 1109  Pulse 102 05/09/22 1115  Resp 23 05/09/22 1115  SpO2 94 % 05/09/22 1115  Vitals shown include unvalidated device data.  Last Pain:  Vitals:   05/09/22 1109  TempSrc:   PainSc: Asleep         Complications: No notable events documented.

## 2022-05-09 NOTE — Progress Notes (Signed)
Spoke with the patients' husband and confirmed that she does have a rolling walker and 3 in 1 at home from a previous surgery She is set up for [facility] PT with [place] Her husband provides transportation She can afford her medication

## 2022-05-09 NOTE — Op Note (Signed)
OPERATIVE NOTE  DATE OF SURGERY:  05/09/2022  PATIENT NAME:  Cindy Sandoval   DOB: [birth year]  MRN: [id number]  PRE-OPERATIVE DIAGNOSIS: Degenerative arthrosis of the left knee, primary  POST-OPERATIVE DIAGNOSIS:  Same  PROCEDURE:  Left total knee arthroplasty using computer-assisted navigation  SURGEON:  Marciano Sequin. M.D.  ANESTHESIA: spinal  ESTIMATED BLOOD LOSS: 50 mL  FLUIDS REPLACED: 1000 mL of crystalloid  TOURNIQUET TIME: 94 minutes  DRAINS: 2 medium Hemovac drains  SOFT TISSUE RELEASES: Anterior cruciate ligament, posterior cruciate ligament, deep medial collateral ligament, patellofemoral ligament  IMPLANTS UTILIZED: DePuy Attune size 3 posterior stabilized femoral component (cemented), size 4 rotating platform tibial component (cemented), 35 mm medialized dome patella (cemented), and an 8 mm stabilized rotating platform polyethylene insert.  INDICATIONS FOR SURGERY: Cindy Sandoval is a 71 y.o. year old female with a long history of progressive knee pain. X-rays demonstrated severe degenerative changes in tricompartmental fashion. The patient had not seen any significant improvement despite conservative nonsurgical intervention. After discussion of the risks and benefits of surgical intervention, the patient expressed understanding of the risks benefits and agree with plans for total knee arthroplasty.   The risks, benefits, and alternatives were discussed at length including but not limited to the risks of infection, bleeding, nerve injury, stiffness, blood clots, the need for revision surgery, cardiopulmonary complications, among others, and they were willing to proceed.  PROCEDURE IN DETAIL: The patient was brought into the operating room and, after adequate spinal anesthesia was achieved, a tourniquet was placed on the patient's upper thigh. The patient's knee and leg were cleaned and prepped with alcohol and DuraPrep and draped in the usual sterile  fashion. A "timeout" was performed as per usual protocol. The lower extremity was exsanguinated using an Esmarch, and the tourniquet was inflated to 300 mmHg. An anterior longitudinal incision was made followed by a standard mid vastus approach. The deep fibers of the medial collateral ligament were elevated in a subperiosteal fashion off of the medial flare of the tibia so as to maintain a continuous soft tissue sleeve. The patella was subluxed laterally and the patellofemoral ligament was incised. Inspection of the knee demonstrated severe degenerative changes with full-thickness loss of articular cartilage. Osteophytes were debrided using a rongeur. Anterior and posterior cruciate ligaments were excised. Two 4.0 mm Schanz pins were inserted in the femur and into the tibia for attachment of the array of trackers used for computer-assisted navigation. Hip center was identified using a circumduction technique. Distal landmarks were mapped using the computer. The distal femur and proximal tibia were mapped using the computer. The distal femoral cutting guide was positioned using computer-assisted navigation so as to achieve a 5 distal valgus cut. The femur was sized and it was felt that a size 3 femoral component was appropriate. A size 3 femoral cutting guide was positioned and the anterior cut was performed and verified using the computer. This was followed by completion of the posterior and chamfer cuts. Femoral cutting guide for the central box was then positioned in the center box cut was performed.  Attention was then directed to the proximal tibia. Medial and lateral menisci were excised. The extramedullary tibial cutting guide was positioned using computer-assisted navigation so as to achieve a 0 varus-valgus alignment and 3 posterior slope. The cut was performed and verified using the computer. The proximal tibia was sized and it was felt that a size 4 tibial tray was appropriate. Tibial and femoral  trials were inserted followed  by insertion of an 8 mm polyethylene insert. This allowed for excellent mediolateral soft tissue balancing both in flexion and in full extension. Finally, the patella was cut and prepared so as to accommodate a 35 mm medialized dome patella. A patella trial was placed and the knee was placed through a range of motion with excellent patellar tracking appreciated. The femoral trial was removed after debridement of posterior osteophytes. The central post-hole for the tibial component was reamed followed by insertion of a keel punch. Tibial trials were then removed. Cut surfaces of bone were irrigated with copious amounts of normal saline using pulsatile lavage and then suctioned dry. Polymethylmethacrylate cement with gentamicin was prepared in the usual fashion using a vacuum mixer. Cement was applied to the cut surface of the proximal tibia as well as along the undersurface of a size 4 rotating platform tibial component. Tibial component was positioned and impacted into place. Excess cement was removed using [employer] fast streamer. Cement was then applied to the cut surfaces of the femur as well as along the posterior flanges of the size 3 femoral component. The femoral component was positioned and impacted into place. Excess cement was removed using [employer] fast streamer. An 8 mm polyethylene trial was inserted and the knee was brought into full extension with steady axial compression applied. Finally, cement was applied to the backside of a 35 mm medialized dome patella and the patellar component was positioned and patellar clamp applied. Excess cement was removed using [employer] fast streamer. After adequate curing of the cement, the tourniquet was deflated after a total tourniquet time of 94 minutes. Hemostasis was achieved using electrocautery. The knee was irrigated with copious amounts of normal saline using pulsatile lavage followed by 450 ml of Surgiphor and then suctioned dry. 20 mL of 1.3% Exparel  and 60 mL of 0.25% Marcaine in 40 mL of normal saline was injected along the posterior capsule, medial and lateral gutters, and along the arthrotomy site. An 8 mm stabilized rotating platform polyethylene insert was inserted and the knee was placed through a range of motion with excellent mediolateral soft tissue balancing appreciated and excellent patellar tracking noted. 2 medium drains were placed in the wound bed and brought out through separate stab incisions. The medial parapatellar portion of the incision was reapproximated using interrupted sutures of #1 Vicryl. Subcutaneous tissue was approximated in layers using first #0 Vicryl followed #2-0 Vicryl. The skin was approximated with skin staples. A sterile dressing was applied.  The patient tolerated the procedure well and was transported to the recovery room in stable condition.    Godson Pollan P. Holley Bouche., M.D.

## 2022-05-09 NOTE — Anesthesia Preprocedure Evaluation (Signed)
Anesthesia Evaluation  Patient identified by MRN, date of birth, ID band Patient awake    Reviewed: Allergy & Precautions, NPO status , Patient's Chart, lab work & pertinent test results  History of Anesthesia Complications (+) history of anesthetic complications ( "hard spinal")  Airway Mallampati: III  TM Distance: <3 FB Neck ROM: full    Dental  (+) Chipped, Poor Dentition, Missing, Implants   Pulmonary neg shortness of breath, asthma ,    Pulmonary exam normal        Cardiovascular Exercise Tolerance: Good hypertension, (-) angina(-) Past MI Normal cardiovascular exam     Neuro/Psych negative neurological ROS  negative psych ROS   GI/Hepatic Neg liver ROS, GERD  Controlled,  Endo/Other  diabetes, Type 2  Renal/GU      Musculoskeletal   Abdominal   Peds  Hematology negative hematology ROS (+)   Anesthesia Other Findings Past Medical History: No date: Arthritis     Comment:  OSTEOARTHRITIS No date: Asthma due to seasonal allergies     Comment:  SEVERE sinus drainage. difficulty breathing if laying               flat No date: Baker's cyst of knee No date: Cancer [facility])     Comment:  basal cell No date: Dental root implant present No date: Diabetes mellitus without complication ([place])     Comment:  TYPE I, USES INSULIN PUMP No date: Diabetic arthropathy (HCC) No date: Family history of adverse reaction to anesthesia     Comment:  sister n/v takes long time to wake up No date: Fatty liver No date: GERD (gastroesophageal reflux disease) 2019: Glaucoma No date: Hyperlipidemia No date: Hypertension 08/2018: Insulin pump in place No date: Microalbuminuria 2014: Shingles  Past Surgical History: 1982: BREAST BIOPSY; Left     Comment:  neg No date: COLONOSCOPY 10/20/2016: COLONOSCOPY WITH PROPOFOL; N/A     Comment:  Procedure: COLONOSCOPY WITH PROPOFOL;  Surgeon: Lollie Sails, MD;   Location: [facility] ENDOSCOPY;  Service:               Endoscopy;  Laterality: N/A; No date: ESOPHAGOGASTRODUODENOSCOPY No date: EYE SURGERY; Bilateral     Comment:  trabeculoplasty 06/2018 09/06/2018: KNEE ARTHROPLASTY; Right     Comment:  Procedure: COMPUTER ASSISTED TOTAL KNEE ARTHROPLASTY;                Surgeon: Dereck Leep, MD;  Location: [facility] ORS;                Service: Orthopedics;  Laterality: Right; 07/2018: PANRETINAL PHOTOCOAGULATION; Left 2005: POPLITEAL SYNOVIAL CYST EXCISION; Left No date: REFRACTIVE SURGERY; Bilateral     Comment:  blood vessels draining/bleeding . pressures still               elevated No date: TOTAL KNEE ARTHROPLASTY; Right  BMI    Body Mass Index: 35.52 kg/m      Reproductive/Obstetrics negative OB ROS                             Anesthesia Physical Anesthesia Plan  ASA: 3  Anesthesia Plan: Spinal   Post-op Pain Management:    Induction:   PONV Risk Score and Plan:   Airway Management Planned: Natural Airway and Nasal Cannula  Additional Equipment:   Intra-op Plan:   Post-operative Plan:   Informed Consent:  I have reviewed the patients History and Physical, chart, labs and discussed the procedure including the risks, benefits and alternatives for the proposed anesthesia with the patient or authorized representative who has indicated his/her understanding and acceptance.     Dental Advisory Given  Plan Discussed with: Anesthesiologist, CRNA and Surgeon  Anesthesia Plan Comments: (Patient reports no bleeding problems and no anticoagulant use.  Plan for spinal with backup GA  Patient consented for risks of anesthesia including but not limited to:  - adverse reactions to medications - damage to eyes, teeth, lips or other oral mucosa - nerve damage due to positioning  - risk of bleeding, infection and or nerve damage from spinal that could lead to paralysis - risk of headache or failed spinal -  damage to teeth, lips or other oral mucosa - sore throat or hoarseness - damage to heart, brain, nerves, lungs, other parts of body or loss of life  Patient voiced understanding.)        Anesthesia Quick Evaluation

## 2022-05-09 NOTE — Evaluation (Signed)
Physical Therapy Evaluation Patient Details Name: Cindy Sandoval MRN: [id number] DOB: 01/20/1951 Today's Date: 05/09/2022  History of Present Illness  Pt is a 71 yo female s/p L TKA. PMH of asthma, HTN, DM, R TKA.  Clinical Impression  Patient alert, agreeable to PT, did not quantify any knee pain with mobility or during session. Pt reported at home she is independent, lives with her spouse.   She was able to perform a few supine exercises without complaint, and supine to sit with modI. Good sitting balance noted and pt assisted with donning her underwear at EOB, minA. Sit <> stand twice, once without the RW and pt educated on importance of RW; able to perform again with supervision. She took several steps to recliner in the room supervision as well. Further mobility deferred at this time due to pt complaints of nausea and some dizziness.  Overall the patient demonstrated deficits (see "PT Problem List") that impede the patient's functional abilities, safety, and mobility and would benefit from skilled PT intervention. Recommendation at this time is HHPT with intermittent supervision/assistance pending further progress with mobility.          Recommendations for follow up therapy are one component of a multi-disciplinary discharge planning process, led by the attending physician.  Recommendations may be updated based on patient status, additional functional criteria and insurance authorization.  Follow Up Recommendations Home health PT      Assistance Recommended at Discharge Intermittent Supervision/Assistance  Patient can return home with the following  A little help with walking and/or transfers;A little help with bathing/dressing/bathroom;Assistance with cooking/housework;Assist for transportation;Help with stairs or ramp for entrance    Equipment Recommendations None recommended by PT  Recommendations for Other Services       Functional Status Assessment Patient has had a  recent decline in their functional status and demonstrates the ability to make significant improvements in function in a reasonable and predictable amount of time.     Precautions / Restrictions Precautions Precautions: Fall;Knee Precaution Booklet Issued: Yes (comment) Restrictions Weight Bearing Restrictions: Yes LLE Weight Bearing: Weight bearing as tolerated      Mobility  Bed Mobility Overal bed mobility: Modified Independent                  Transfers Overall transfer level: Needs assistance Equipment used: Rolling walker (2 wheels), None Transfers: Sit to/from Stand, Bed to chair/wheelchair/BSC Sit to Stand: Supervision Stand pivot transfers: Supervision         General transfer comment: pt stands impulsively without RW, but with education utilized RW for second attempt and to transfer to recliner    Ambulation/Gait               General Gait Details: deferred due to pt nausea/dizziness  Stairs            Wheelchair Mobility    Modified Rankin (Stroke Patients Only)       Balance Overall balance assessment: Needs assistance Sitting-balance support: Feet supported Sitting balance-Leahy Scale: Good Sitting balance - Comments: assisted to don underwear, minA for comfort but not necessarily required   Standing balance support: During functional activity Standing balance-Leahy Scale: Fair                               Pertinent Vitals/Pain Pain Assessment Pain Assessment: Faces Faces Pain Scale: No hurt Pain Location: pt did not quantify any knee pain during session  Home Living Family/patient expects to be discharged to:: Private residence Living Arrangements: Spouse/significant other Available Help at Discharge: Family;Available 24 hours/day Type of Home: House Home Access: Stairs to enter   [employer] of Steps: 5 (to a room in the home, flat entrance to the home itself)     Home Equipment: Chartered certified accountant (2 wheels)      Prior Function Prior Level of Function : Independent/Modified Independent;Driving                     Hand Dominance        Extremity/Trunk Assessment   Upper Extremity Assessment Upper Extremity Assessment: Overall WFL for tasks assessed    Lower Extremity Assessment Lower Extremity Assessment: RLE deficits/detail;LLE deficits/detail RLE Deficits / Details: WFLs LLE Deficits / Details: s/p TKA       Communication   Communication: No difficulties  Cognition Arousal/Alertness: Awake/alert Behavior During Therapy: WFL for tasks assessed/performed Overall Cognitive Status: Within Functional Limits for tasks assessed                                          General Comments      Exercises Total Joint Exercises Ankle Circles/Pumps: AROM, Both, 10 reps Knee Flexion: AROM, Left, 10 reps, Strengthening Goniometric ROM: 0-70 degrees   Assessment/Plan    PT Assessment Patient needs continued PT services  PT Problem List Decreased mobility;Decreased strength;Decreased activity tolerance;Decreased balance;Pain;Decreased knowledge of use of DME;Decreased range of motion;Decreased knowledge of precautions       PT Treatment Interventions DME instruction;Therapeutic exercise;Gait training;Balance training;Stair training;Neuromuscular re-education;Functional mobility training;Therapeutic activities;Patient/family education    PT Goals (Current goals can be found in the Care Plan section)  Acute Rehab PT Goals Patient Stated Goal: to go home PT Goal Formulation: With patient Time For Goal Achievement: 05/23/22 Potential to Achieve Goals: Fair    Frequency BID     Co-evaluation               AM-PAC PT "6 Clicks" Mobility  Outcome Measure Help needed turning from your back to your side while in a flat bed without using bedrails?: None Help needed moving from lying on your back to sitting on the side of a flat bed  without using bedrails?: None Help needed moving to and from a bed to a chair (including a wheelchair)?: None Help needed standing up from a chair using your arms (e.g., wheelchair or bedside chair)?: None Help needed to walk in hospital room?: A Little Help needed climbing 3-5 steps with a railing? : A Little 6 Click Score: 22    End of Session Equipment Utilized During Treatment: Gait belt Activity Tolerance: Patient tolerated treatment well;Other (comment) (limited nausea, some dizziness) Patient left: with chair alarm set;in chair;with call bell/phone within reach Nurse Communication: Mobility status PT Visit Diagnosis: Other abnormalities of gait and mobility (R26.89);Difficulty in walking, not elsewhere classified (R26.2);Muscle weakness (generalized) (M62.81);Pain Pain - Right/Left: Left Pain - part of body: Knee    Time: 9678-9381 PT Time Calculation (min) (ACUTE ONLY): 23 min   Charges:   PT Evaluation $PT Eval Low Complexity: 1 Low PT Treatments $Therapeutic Activity: 8-22 mins       Lieutenant Diego PT, DPT 2:25 PM,05/09/22

## 2022-05-09 NOTE — Interval H&P Note (Signed)
History and Physical Interval Note:  05/09/2022 6:09 AM  Cindy Sandoval  has presented today for surgery, with the diagnosis of PRIMARY OSTEOARTHRITIS OF LEFT KNEE.  The various methods of treatment have been discussed with the patient and family. After consideration of risks, benefits and other options for treatment, the patient has consented to  Procedure(s): COMPUTER ASSISTED TOTAL KNEE ARTHROPLASTY - RNFA (Left) as a surgical intervention.  The patient's history has been reviewed, patient examined, no change in status, stable for surgery.  I have reviewed the patient's chart and labs.  Questions were answered to the patient's satisfaction.     [place]

## 2022-05-09 NOTE — Progress Notes (Signed)
Inpatient Diabetes Program Recommendations  AACE/ADA: New Consensus Statement on Inpatient Glycemic Control (2015)  Target Ranges:  Prepandial:   less than 140 mg/dL      Peak postprandial:   less than 180 mg/dL (1-2 hours)      Critically ill patients:  140 - 180 mg/dL   Lab Results  Component Value Date   GLUCAP 222 (H) 05/09/2022   HGBA1C 6.3 (H) 04/29/2022    Review of Glycemic Control  Latest Reference Range & Units 05/09/22 06:47 05/09/22 09:36 05/09/22 11:22  Glucose-Capillary 70 - 99 mg/dL 150 (H) 185 (H) 222 (H)   Diabetes history: DM 1 Outpatient Diabetes medications:  T Slim insulin pump MN-0.75 units/hr 7a-0.75 units/hr 9p-0.7 units/hr TDD basal=17.9% Insulin to carb ration 1 unit/6 grams of CHO ISF- MN-55 mg/dL, 7a-35 mg/dL, 9p-55 mg/dL  Current orders for Inpatient glycemic control:  Insulin pump Novolog moderate tid with meals and HS  Inpatient Diabetes Program Recommendations:    Note that patient is on insulin pump.  She is alert and oriented and sitting up in chair.   States that she put pump in "active mode" during surgery.  Her glucose currently in 115 mg/dL (per her CGM).  Explained briefly the insulin pump policy.    Please d/c Novolog moderate tid with meals and HS- Thanks,   Adah Perl, RN, BC-ADM Inpatient Diabetes Coordinator Pager [phone]  (8a-5p)

## 2022-05-09 NOTE — Progress Notes (Signed)
Inpatient Diabetes Program Recommendations  AACE/ADA: New Consensus Statement on Inpatient Glycemic Control (2015)  Target Ranges:  Prepandial:   less than 140 mg/dL      Peak postprandial:   less than 180 mg/dL (1-2 hours)      Critically ill patients:  140 - 180 mg/dL   Lab Results  Component Value Date   GLUCAP 150 (H) 05/09/2022   HGBA1C 6.3 (H) 04/29/2022    Review of Glycemic Control  Latest Reference Range & Units 05/09/22 06:47  Glucose-Capillary 70 - 99 mg/dL 150 (H)   Diabetes history: DM 1 Outpatient Diabetes medications:  T:Slim insulin pump-  Inpatient Diabetes Program Recommendations:    Referral received.  Will see patient once out of PACU.  Will follow.  Thanks,  Adah Perl, RN, BC-ADM Inpatient Diabetes Coordinator Pager [phone]  (8a-5p)

## 2022-05-10 DIAGNOSIS — M1712 Unilateral primary osteoarthritis, left knee: Secondary | ICD-10-CM | POA: Diagnosis not present

## 2022-05-10 LAB — GLUCOSE, CAPILLARY
Glucose-Capillary: 268 mg/dL — ABNORMAL HIGH (ref 70–99)
Glucose-Capillary: 221 mg/dL — ABNORMAL HIGH (ref 70–99)

## 2022-05-10 MED ORDER — CELECOXIB 200 MG PO CAPS: 200.0000 mg | ORAL_CAPSULE | Freq: Two times a day (BID) | ORAL | 0 refills | Status: AC

## 2022-05-10 MED ORDER — ENOXAPARIN SODIUM 40 MG/0.4ML IJ SOSY: 40.0000 mg | PREFILLED_SYRINGE | INTRAMUSCULAR | 0 refills | Status: AC

## 2022-05-10 MED ORDER — ONDANSETRON HCL 4 MG PO TABS: 4.0000 mg | ORAL_TABLET | Freq: Four times a day (QID) | ORAL | 0 refills | Status: AC | PRN

## 2022-05-10 MED ORDER — OXYCODONE HCL 5 MG PO TABS: 5.0000 mg | ORAL_TABLET | ORAL | 0 refills | Status: AC | PRN

## 2022-05-10 MED ORDER — ACETAMINOPHEN 500 MG PO TABS: 500.0000 mg | ORAL_TABLET | Freq: Four times a day (QID) | ORAL | 0 refills | Status: AC | PRN

## 2022-05-10 MED ORDER — TRAMADOL HCL 50 MG PO TABS: 50.0000 mg | ORAL_TABLET | ORAL | 0 refills | Status: AC | PRN

## 2022-05-10 NOTE — Discharge Summary (Signed)
Physician Discharge Summary  Patient ID: Cindy Sandoval MRN: [id number] DOB/AGE: 69-Nov-1952 69 y.o.  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 ([place]) 10/07/2018   Hyperlipidemia due to type 1 diabetes mellitus ([place]) 09/06/2018   Type 1 diabetes mellitus with microalbuminuria ([place]) 09/06/2018   Total knee replacement status 09/06/2018   Hypertensive retinopathy, grade 2, bilateral 06/17/2018   Hypoglycemia associated with diabetes ([place]) 04/27/2014   Insulin pump titration 04/27/2014   Microalbuminuria 04/27/2014   GERD (gastroesophageal reflux disease) 06/07/2013   Hypertension associated with diabetes ([place]) 06/07/2013   Osteoarthritis 05/19/2012    Past Medical History:  Diagnosis Date   Arthritis    OSTEOARTHRITIS   Asthma due to seasonal allergies    SEVERE sinus drainage. difficulty breathing if laying flat   Baker's cyst of knee    Cancer ([place])    basal cell   Dental root implant present    Diabetes mellitus without complication ([place])    TYPE I, USES INSULIN PUMP   Diabetic arthropathy ([place])    Family history of adverse reaction to anesthesia    sister n/v takes long time to wake up   Fatty liver    GERD (gastroesophageal reflux disease)    Glaucoma 2019   Hyperlipidemia    Hypertension    Insulin pump in place 08/2018   Microalbuminuria    Shingles 2014     Transfusion:  None.   Consultants (if any):   Discharged Condition: Improved  Hospital Course: Cindy Sandoval is an 71 y.o. female who was admitted 05/09/2022 with a diagnosis of primary degenerative arthrosis of the left knee and went to the operating room on 05/09/2022 and underwent the above named procedures.    Surgeries: Procedure(s): COMPUTER ASSISTED TOTAL KNEE ARTHROPLASTY on 05/09/2022 Patient tolerated the surgery well. Taken to PACU where she was stabilized and then transferred to the orthopedic floor.  Started on Lovenox '30mg'$  q 12 hrs. TED hose applied. Heels elevated on bed with rolled towels. No evidence of DVT. Negative [place]. Physical therapy started on day #1 for gait training and transfer. OT started day #1 for ADL and assisted devices.  Patient's IV and hemovac were removed on POD1.  Foley removed shortly after surgery.  Implants: DePuy Attune size 3 posterior stabilized femoral component (cemented), size 4 rotating platform tibial component (cemented), 35 mm medialized dome patella (cemented), and an 8 mm stabilized rotating platform polyethylene insert.  She was given perioperative antibiotics:  Anti-infectives (From admission, onward)    Start     Dose/Rate Route Frequency Ordered Stop   05/09/22 1400  ceFAZolin (ANCEF) IVPB 2g/100 mL premix        2 g 200 mL/hr over 30 Minutes Intravenous Every 6 hours 05/09/22 1220 05/09/22 2058   05/09/22 0631  ceFAZolin (ANCEF) 2-4 GM/100ML-% IVPB       Note to Pharmacy: [place] Blizzard A: cabinet override      05/09/22 0631 05/09/22 0749   05/09/22 0600  ceFAZolin (  ANCEF) IVPB 2g/100 mL premix        2 g 200 mL/hr over 30 Minutes Intravenous On call to O.R. 05/08/22 2231 05/09/22 0747     .  She was given sequential compression devices, early ambulation, and Lovenox for DVT prophylaxis.  She benefited maximally from the hospital stay and there were no complications.    Recent vital signs:  Vitals:   05/09/22 2015 05/10/22  0104  BP: 113/61 123/64  Pulse: 94 76  Resp: 20 19  Temp: 98 F (36.7 C) 97.7 F (36.5 C)  SpO2: 95% 97%    Recent laboratory studies:  Lab Results  Component Value Date   HGB 14.1 04/29/2022   HGB 14.1 09/01/2018   Lab Results  Component Value Date   WBC 7.7 04/29/2022   PLT 306 04/29/2022   Lab Results  Component Value Date   INR 0.94 09/01/2018   Lab Results  Component Value Date   NA 132 (L) 04/29/2022   K 4.2 04/29/2022   CL 95 (L) 04/29/2022   CO2 25 04/29/2022   BUN 17 04/29/2022   CREATININE 0.70 04/29/2022   GLUCOSE 122 (H) 04/29/2022    Discharge Medications:   Allergies as of 05/10/2022       Reactions   Kiwi Extract Anaphylaxis   Septra [sulfamethoxazole-trimethoprim] Nausea And Vomiting   Required patient to be hospitalized after taking this   Adhesive [tape] Rash   tegaderm is okay. Broke out in rash around pump with plastic tape. USE TAPE SPARINGLY   Bactroban [mupirocin Calcium] Swelling   Used around knee and caused swelling   Insulin Glargine Hives   lantus caused problem   Lisinopril Other (See Comments)   BODY ACHED EVERYWHERE. NO ENERGY   Simvastatin Other (See Comments)   BODY ACHED EVERYWHERE   Cephalexin Diarrhea   Dexilant [dexlansoprazole] Other (See Comments)   Did not help digestive issues   Latanoprost    Other reaction(s): Other (See Comments) Causes ocular discomfort and burning   Mupirocin Swelling   Redness at site        Medication List     STOP taking these medications    aspirin EC 81 MG tablet       TAKE these medications    acetaminophen 500 MG tablet Commonly known as: TYLENOL Take 1-2 tablets (500-1,000 mg total) by mouth every 6 (six) hours as needed. What changed:  how much to take when to take this reasons to take this   amLODipine 10 MG tablet Commonly known as: NORVASC Take 10 mg by mouth at bedtime.   Azelastine-Fluticasone 137-50 MCG/ACT Susp Place 1 spray into both nostrils daily  as needed.   celecoxib 200 MG capsule Commonly known as: CELEBREX Take 1 capsule (200 mg total) by mouth 2 (two) times daily.   cetirizine 10 MG tablet Commonly known as: ZYRTEC Take 10 mg by mouth daily.   diclofenac sodium 1 % Gel Commonly known as: VOLTAREN Apply 1 application topically 3 (three) times daily as needed (pain).   enoxaparin 40 MG/0.4ML injection Commonly known as: LOVENOX Inject 0.4 mLs (40 mg total) into the skin daily.   insulin aspart 100 UNIT/ML injection Commonly known as: novoLOG Inject into the skin. Pt uses in INSULIN PUMP   insulin detemir 100 UNIT/ML injection Commonly known as: LEVEMIR Inject into the skin. Uses as a backup if PUMP gives out   losartan 100 MG tablet Commonly known as: COZAAR Take 100 mg by mouth daily.  Melatonin 10 MG/ML Liqd Take 5 mg by mouth at bedtime as needed.   NON FORMULARY Take 1,000 mcg by mouth daily. Beet Root   omeprazole 40 MG capsule Commonly known as: PRILOSEC Take 40 mg by mouth daily.   ondansetron 4 MG tablet Commonly known as: ZOFRAN Take 1 tablet (4 mg total) by mouth every 6 (six) hours as needed for nausea.   oxyCODONE 5 MG immediate release tablet Commonly known as: Oxy IR/ROXICODONE Take 1-2 tablets (5-10 mg total) by mouth every 4 (four) hours as needed for severe pain.   pravastatin 20 MG tablet Commonly known as: PRAVACHOL Take 20 mg by mouth at bedtime.   T:slim G4 Insulin Pump Devi Inject into the skin.   traMADol 50 MG tablet Commonly known as: ULTRAM Take 1-2 tablets (50-100 mg total) by mouth every 4 (four) hours as needed for moderate pain.   VITAMIN B12 PO Take 2,500 mcg by mouth daily.               Durable Medical Equipment  (From admission, onward)           Start     Ordered   05/09/22 1221  DME Walker rolling  Once       Question:  Patient needs a walker to treat with the following condition  Answer:  Total knee replacement status   05/09/22 1220    05/09/22 1221  DME Bedside commode  Once       Question:  Patient needs a bedside commode to treat with the following condition  Answer:  Total knee replacement status   05/09/22 1220            Diagnostic Studies: DG Knee Left Port  Result Date: 05/09/2022 CLINICAL DATA:  Post left total knee replacement. EXAM: PORTABLE LEFT KNEE - 1-2 VIEW COMPARISON:  None Available. FINDINGS: Examination demonstrates evidence of patient's total left knee arthroplasty with prosthetic components intact and normally located. Surgical drain over the suprapatellar region. Skin staples over the anterior midline soft tissues. Remainder of the exam is unremarkable. IMPRESSION: Expected changes post total left knee arthroplasty. Electronically Signed   By: Marin Olp M.D.   On: 05/09/2022 12:02   CT ABDOMEN PELVIS W CONTRAST  Result Date: 04/11/2022 CLINICAL DATA:  Several months of right upper quadrant abdominal pain. EXAM: CT ABDOMEN AND PELVIS WITH CONTRAST TECHNIQUE: Multidetector CT imaging of the abdomen and pelvis was performed using the standard protocol following bolus administration of intravenous contrast. RADIATION DOSE REDUCTION: This exam was performed according to the departmental dose-optimization program which includes automated exposure control, adjustment of the mA and/or kV according to patient size and/or use of iterative reconstruction technique. CONTRAST:  121m OMNIPAQUE IOHEXOL 300 MG/ML  SOLN COMPARISON:  03/18/2022 abdominal sonogram.  11/05/2012 CT abdomen. FINDINGS: Lower chest: No significant pulmonary nodules or acute consolidative airspace disease. Coronary atherosclerosis. Hepatobiliary: Normal liver size. A few scattered subcentimeter hypodense liver lesions are too small to characterize. Normal gallbladder with no radiopaque cholelithiasis. No biliary ductal dilatation. Pancreas: Normal, with no mass or duct dilation. Spleen: Normal size. No mass. Adrenals/Urinary Tract: Normal  adrenals. Subcentimeter hypodense posterior interpolar right renal cortical lesion, too small to characterize, for which no imaging follow-up is recommended. Otherwise normal kidneys, with no hydronephrosis. Normal bladder. Stomach/Bowel: Normal non-distended stomach. Normal caliber small bowel with no small bowel wall thickening. Normal appendix. Moderate colonic stool volume, most prominent in the right colon. Mild left colonic diverticulosis, with no large bowel  wall thickening or significant pericolonic fat stranding. Vascular/Lymphatic: Atherosclerotic nonaneurysmal abdominal aorta. Patent portal, splenic, hepatic and renal veins. No pathologically enlarged lymph nodes in the abdomen or pelvis. Reproductive: No adnexal masses. Mildly enlarged anteverted uterus with possible posterior uterine body 3 cm fibroid, poorly delineated by CT. Other: No pneumoperitoneum, ascites or focal fluid collection. Musculoskeletal: No aggressive appearing focal osseous lesions. Marked thoracolumbar spondylosis. IMPRESSION: 1. No acute abnormality. No evidence of bowel obstruction or acute bowel inflammation. Normal appendix. Mild left colonic diverticulosis, with no evidence of acute diverticulitis. 2. Moderate colonic stool volume, suggesting constipation. 3. Mildly enlarged anteverted uterus with possible posterior uterine body 3 cm fibroid, poorly delineated by CT. Pelvic ultrasound correlation may be obtained as clinically warranted. 4. Coronary atherosclerosis. 5. Aortic Atherosclerosis (ICD10-I70.0). Electronically Signed   By: Ilona Sorrel M.D.   On: 04/11/2022 16:44    Disposition: Plan for discharge home today pending progress with PT.     Follow-up Information     Lattie Corns, PA-C Follow up on 05/23/2022.   Specialty: Physician Assistant Why: at 1:45pm Contact information: [place] [address] [phone]         Dereck Leep, MD Follow up on 06/19/2022.    Specialty: Orthopedic Surgery Why: at 10:30am Contact information: [place] [address] [phone]                Signed: Judson Roch PA-C 05/10/2022, 8:02 AM

## 2022-05-10 NOTE — Progress Notes (Signed)
Physical Therapy Treatment Patient Details Name: Cindy Sandoval MRN: [id number] DOB: Jan 30, 1951 Today's Date: 05/10/2022   History of Present Illness Pt is a 71 yo female s/p L TKA. PMH of asthma, HTN, DM, R TKA.    PT Comments    Pt was sitting in recliner upon arriving. A and O x 4 and motivated to improve. She was able to stand and ambulate without physical assistance or LOB. Performed stairs safely prior to returning to room and performing ROM. She tolerated stretching well and is progressing extremely well. Pt is cleared from a PT standpoint to safety DC home with HHPT to follow.    Recommendations for follow up therapy are one component of a multi-disciplinary discharge planning process, led by the attending physician.  Recommendations may be updated based on patient status, additional functional criteria and insurance authorization.  Follow Up Recommendations  Home health PT (scheduled to start tomorrow)     Assistance Recommended at Discharge Intermittent Supervision/Assistance  Patient can return home with the following A little help with walking and/or transfers;A little help with bathing/dressing/bathroom;Assistance with cooking/housework;Assist for transportation;Help with stairs or ramp for entrance   Equipment Recommendations  None recommended by PT       Precautions / Restrictions Precautions Precautions: Fall;Knee Precaution Booklet Issued: Yes (comment) Restrictions Weight Bearing Restrictions: Yes LLE Weight Bearing: Weight bearing as tolerated     Mobility  Bed Mobility Overal bed mobility: Modified Independent                  Transfers Overall transfer level: Modified independent                 General transfer comment: no physical assistance or VCs. demnstrated safe technique and abilities    Ambulation/Gait Ambulation/Gait assistance: Supervision Gait Distance (Feet): 200 Feet Assistive device: Rolling walker (2 wheels) Gait  Pattern/deviations: Step-through pattern Gait velocity: decreased     General Gait Details: pt demonstrated safe steady gait kinematics without LOB or safety concern.   Stairs Stairs: Yes Stairs assistance: Supervision Stair Management: One rail Left, Step to pattern, Forwards Number of Stairs: 4 General stair comments: pt was easily and safely able to ascend/descend stairs.   Wheelchair Mobility    Modified Rankin (Stroke Patients Only)       Balance Overall balance assessment: Needs assistance Sitting-balance support: Feet supported Sitting balance-Leahy Scale: Good     Standing balance support: During functional activity Standing balance-Leahy Scale: Good                              Cognition Arousal/Alertness: Awake/alert Behavior During Therapy: WFL for tasks assessed/performed Overall Cognitive Status: Within Functional Limits for tasks assessed                                 General Comments: Pt is A and O x 4        Exercises Total Joint Exercises Goniometric ROM: 3-92    General Comments General comments (skin integrity, edema, etc.): reviewed importance of HEP,Polar acre, and knee extension when sitting. Pt states understanding.      Pertinent Vitals/Pain Pain Assessment Pain Assessment: No/denies pain Faces Pain Scale: No hurt    Home Living                          Prior Function  PT Goals (current goals can now be found in the care plan section) Acute Rehab PT Goals Patient Stated Goal: go home ASAP Progress towards PT goals: Progressing toward goals    Frequency    BID      PT Plan Current plan remains appropriate    Co-evaluation              AM-PAC PT "6 Clicks" Mobility   Outcome Measure  Help needed turning from your back to your side while in a flat bed without using bedrails?: None Help needed moving from lying on your back to sitting on the side of a flat bed  without using bedrails?: None Help needed moving to and from a bed to a chair (including a wheelchair)?: None Help needed standing up from a chair using your arms (e.g., wheelchair or bedside chair)?: None Help needed to walk in hospital room?: None Help needed climbing 3-5 steps with a railing? : A Little 6 Click Score: 23    End of Session   Activity Tolerance: Patient tolerated treatment well;Other (comment) Patient left: in chair;with call bell/phone within reach Nurse Communication: Mobility status PT Visit Diagnosis: Other abnormalities of gait and mobility (R26.89);Difficulty in walking, not elsewhere classified (R26.2);Muscle weakness (generalized) (M62.81);Pain Pain - Right/Left: Left Pain - part of body: Knee     Time: 4580-9983 PT Time Calculation (min) (ACUTE ONLY): 22 min  Charges:  $Gait Training: 8-22 mins                     Julaine Fusi PTA 05/10/22, 9:13 AM

## 2022-05-10 NOTE — Anesthesia Postprocedure Evaluation (Signed)
Anesthesia Post Note  Patient: Cindy Sandoval  Procedure(s) Performed: COMPUTER ASSISTED TOTAL KNEE ARTHROPLASTY (Left: Knee)  Patient location during evaluation: PACU Anesthesia Type: Spinal Level of consciousness: awake and alert Pain management: pain level controlled Vital Signs Assessment: post-procedure vital signs reviewed and stable Respiratory status: spontaneous breathing, nonlabored ventilation, respiratory function stable and patient connected to nasal cannula oxygen Cardiovascular status: blood pressure returned to baseline and stable Postop Assessment: no apparent nausea or vomiting Anesthetic complications: no   No notable events documented.   Last Vitals:  Vitals:   05/09/22 2015 05/10/22 0104  BP: 113/61 123/64  Pulse: 94 76  Resp: 20 19  Temp: 36.7 C 36.5 C  SpO2: 95% 97%    Last Pain:  Vitals:   05/09/22 2116  TempSrc:   PainSc: 0-No pain                 Dimas Millin

## 2022-05-10 NOTE — Progress Notes (Signed)
  Subjective: 1 Day Post-Op Procedure(s) (LRB): COMPUTER ASSISTED TOTAL KNEE ARTHROPLASTY (Left) Patient reports pain as mild.   Patient is well, and has had no acute complaints or problems Plan is to go Home after hospital stay. Negative for chest pain and shortness of breath Fever: no Gastrointestinal:Negative for nausea and vomiting Reports she is passing gas without pain this morning.  Objective: Vital signs in last 24 hours: Temp:  [97.7 F (36.5 C)-98.4 F (36.9 C)] 97.7 F (36.5 C) (09/16 0104) Pulse Rate:  [76-95] 76 (09/16 0104) Resp:  [17-23] 19 (09/16 0104) BP: (95-134)/(51-74) 123/64 (09/16 0104) SpO2:  [90 %-99 %] 97 % (09/16 0104)  Intake/Output from previous day:  Intake/Output Summary (Last 24 hours) at 05/10/2022 0755 Last data filed at 05/10/2022 0648 Gross per 24 hour  Intake 1445.71 ml  Output 1160 ml  Net 285.71 ml    Intake/Output this shift: No intake/output data recorded.  Labs: No results for input(s): "HGB" in the last 72 hours. No results for input(s): "WBC", "RBC", "HCT", "PLT" in the last 72 hours. No results for input(s): "NA", "K", "CL", "CO2", "BUN", "CREATININE", "GLUCOSE", "CALCIUM" in the last 72 hours. No results for input(s): "LABPT", "INR" in the last 72 hours.   EXAM General - Patient is Alert, Appropriate, and Oriented Extremity - ABD soft Neurovascular intact Dorsiflexion/Plantar flexion intact Incision: dressing C/D/I No cellulitis present Dressing/Incision - Bulky dressing removed, hemovac removed without issue this morning. Motor Function - intact, moving foot and toes well on exam.  ABdomen soft with intact bowel sounds this morning. Intact to light touch in the peroneal nerve distribution, no weakness with dorsiflexion or plantarflexion of the foot.  Past Medical History:  Diagnosis Date   Arthritis    OSTEOARTHRITIS   Asthma due to seasonal allergies    SEVERE sinus drainage. difficulty breathing if laying flat    Baker's cyst of knee    Cancer ([place])    basal cell   Dental root implant present    Diabetes mellitus without complication ([place])    TYPE I, USES INSULIN PUMP   Diabetic arthropathy ([place])    Family history of adverse reaction to anesthesia    sister n/v takes long time to wake up   Fatty liver    GERD (gastroesophageal reflux disease)    Glaucoma 2019   Hyperlipidemia    Hypertension    Insulin pump in place 08/2018   Microalbuminuria    Shingles 2014    Assessment/Plan: 1 Day Post-Op Procedure(s) (LRB): COMPUTER ASSISTED TOTAL KNEE ARTHROPLASTY (Left) Principal Problem:   Total knee replacement status  Estimated body mass index is 35.52 kg/m as calculated from the following:   Height as of this encounter: '5\' 1"'$  (1.549 m).   Weight as of this encounter: 85.3 kg. Advance diet Up with therapy D/C IV fluids when tolerating po intake.  Vitals reviewed, reports her N/V have both significantly improved. Urinating and passing gas without pain. Hemovac removed today, 4x4 with tegaderm applied. Up with therapy today.  Plan for discharge home today pending progress with PT.  DVT Prophylaxis - Lovenox, TED hose, and SCDs Weight-Bearing as tolerated to left leg  J. Cameron Proud, PA-C [facility] 05/10/2022, 7:55 AM

## 2022-05-10 NOTE — TOC Progression Note (Signed)
Transition of Care [facility]) - Progression Note    Patient Details  Name: Kealy Lewter MRN: [id number] Date of Birth: 1950-11-30  Transition of [facility]) CM/SW Contact  Izola Price, RN Phone Number: 05/10/2022, 9:52 AM  Clinical Narrative:   05/10/22: Discharge orders in. Per prior notes patient has RW and 3:1 at home. Set up with [facility] for [facility] services orders on discharge or in office prior to admission. RN CM will notify [facility] of discharge today. Spouse to transport to home upon discharge from acute setting. Simmie Davies RN CM     Expected Discharge Plan: [place] Barriers to Discharge: Barriers Resolved  Expected Discharge Plan and Services Expected Discharge Plan: [place]   Discharge Planning Services: CM Consult   Living arrangements for the past 2 months: Single Family Home Expected Discharge Date: 05/10/22               DME Arranged: N/A DME Agency: NA       HH Arranged: PT HH Agency: [place] Date [place]: 05/09/22 Time [place]: 1548 Representative spoke with at [place]: [place]   Social Determinants of Health ([place]) Interventions    Readmission Risk Interventions     No data to display

## 2022-05-10 NOTE — Evaluation (Signed)
Occupational Therapy Evaluation Patient Details Name: Cindy Sandoval MRN: [id number] DOB: [birth year] Today's Date: 05/10/2022   History of Present Illness Pt is a 71 yo female s/p L TKA. PMH of asthma, HTN, DM, R TKA.   Clinical Impression   Patient seen for OT evaluation this date, spouse present. Patient presenting with decreased endurance, impaired balance, and decreased strength. At baseline, pt was independent in ADLs, IADLs, and functional mobility without an AD. Patient currently functioning at [facility] A for LB dressing using AE, Min guard for clothing management in standing, and set up-supervision for grooming tasks. Pt/spouse were educated on polar care system, precautions for LLE WBAT, and no pillow under L knee. Education and demonstration was provided for LB dressing AE including reacher with good carryover. Spouse was present during session to receive education. No further skilled OT needs indicated at this time.     Recommendations for follow up therapy are one component of a multi-disciplinary discharge planning process, led by the attending physician.  Recommendations may be updated based on patient status, additional functional criteria and insurance authorization.   Follow Up Recommendations  No OT follow up    Assistance Recommended at Discharge Intermittent Supervision/Assistance  Patient can return home with the following A little help with walking and/or transfers;A little help with bathing/dressing/bathroom;Assistance with cooking/housework;Assist for transportation;Help with stairs or ramp for entrance    Functional Status Assessment  Patient has had a recent decline in their functional status and demonstrates the ability to make significant improvements in function in a reasonable and predictable amount of time.  Equipment Recommendations  Other (comment);Tub/shower seat (LB dressing adaptive equipment)    Recommendations for Other Services       Precautions /  Restrictions Precautions Precautions: Fall;Knee Precaution Booklet Issued: Yes (comment) Restrictions Weight Bearing Restrictions: Yes LLE Weight Bearing: Weight bearing as tolerated      Mobility Bed Mobility               General bed mobility comments: NT, received and left in recliner.    Transfers                   General transfer comment: NT, PT reported Mod I with RW.      Balance Overall balance assessment: Needs assistance Sitting-balance support: Feet supported Sitting balance-Leahy Scale: Good                                     ADL either performed or assessed with clinical judgement   ADL Overall ADL's : Needs assistance/impaired Eating/Feeding: Set up   Grooming: Set up;Sitting               Lower Body Dressing: Sitting/lateral leans;With adaptive equipment;Minimal assistance       Toileting- Clothing Manipulation and Hygiene: Min guard;Sit to/from stand               Vision Patient Visual Report: No change from baseline       Perception     Praxis      Pertinent Vitals/Pain Pain Assessment Pain Assessment: No/denies pain     Hand Dominance Right   Extremity/Trunk Assessment Upper Extremity Assessment Upper Extremity Assessment: Overall WFL for tasks assessed   Lower Extremity Assessment Lower Extremity Assessment: LLE deficits/detail LLE Deficits / Details: s/p TKA       Communication Communication Communication: No difficulties   Cognition Arousal/Alertness: Awake/alert Behavior  During Therapy: WFL for tasks assessed/performed Overall Cognitive Status: Within Functional Limits for tasks assessed                                 General Comments: Pt is A&Ox4     General Comments  reviewed importance of HEP,Polar acre, and knee extension when sitting. Pt states understanding.    Exercises Other Exercises Other Exercises: OT provided education re: role of OT, OT POC, post  acute recs, sitting up for all meals, EOB/OOB mobility with assistance, home/fall safety, polar care system, no pillow under knee, LB dressing adaptive equipment, energy conservation, DME for bathing   Shoulder Instructions      Home Living Family/patient expects to be discharged to:: Private residence Living Arrangements: Spouse/significant other Available Help at Discharge: Family;Available 24 hours/day Type of Home: House Home Access: Stairs to enter [employer] of Steps: 5 (to a room in the home, flat entrance to the home itself)         Bathroom Shower/Tub: Occupational psychologist: Handicapped height     Home Equipment: Conservation officer, nature (2 wheels);Grab bars - tub/shower          Prior Functioning/Environment Prior Level of Function : Independent/Modified Independent;Driving               ADLs Comments: IND with ADLs/IADLs.        OT Problem List: Decreased knowledge of use of DME or AE;Decreased knowledge of precautions      OT Treatment/Interventions:      OT Goals(Current goals can be found in the care plan section) Acute Rehab OT Goals Patient Stated Goal: to go home OT Goal Formulation: All assessment and education complete, DC therapy  OT Frequency:      Co-evaluation              AM-PAC OT "6 Clicks" Daily Activity     Outcome Measure Help from another person eating meals?: None Help from another person taking care of personal grooming?: None Help from another person toileting, which includes using toliet, bedpan, or urinal?: A Little Help from another person bathing (including washing, rinsing, drying)?: A Little Help from another person to put on and taking off regular upper body clothing?: None Help from another person to put on and taking off regular lower body clothing?: A Little 6 Click Score: 21   End of Session Nurse Communication: Mobility status  Activity Tolerance: Patient tolerated treatment well Patient  left: in chair;with call bell/phone within reach;with family/visitor present  OT Visit Diagnosis: Muscle weakness (generalized) (M62.81);Other abnormalities of gait and mobility (R26.89)                Time: 7672-0947 OT Time Calculation (min): 12 min Charges:  OT General Charges $OT Visit: 1 Visit OT Evaluation $OT Eval Low Complexity: 1 Low  [facility] MS, OTR/L ascom [phone]  05/10/22, 9:31 AM

## 2022-05-10 NOTE — Plan of Care (Signed)

## 2022-05-12 ENCOUNTER — Encounter: Payer: Self-pay | Admitting: Orthopedic Surgery

## 2022-06-02 ENCOUNTER — Ambulatory Visit: Admit: 2022-06-02 | Payer: Medicare Other | Admitting: Gastroenterology

## 2022-06-02 SURGERY — COLONOSCOPY
Anesthesia: General

## 2022-07-09 ENCOUNTER — Other Ambulatory Visit: Payer: Self-pay | Admitting: Nurse Practitioner

## 2022-07-09 DIAGNOSIS — Z1231 Encounter for screening mammogram for malignant neoplasm of breast: Secondary | ICD-10-CM

## 2022-08-07 ENCOUNTER — Ambulatory Visit
Admission: RE | Admit: 2022-08-07 | Discharge: 2022-08-07 | Disposition: A | Discharge status: Home / Self Care | Payer: Medicare Other | Source: Ambulatory Visit | Attending: Nurse Practitioner | Admitting: Nurse Practitioner

## 2022-08-07 DIAGNOSIS — Z1231 Encounter for screening mammogram for malignant neoplasm of breast: Secondary | ICD-10-CM | POA: Insufficient documentation

## 2022-09-26 ENCOUNTER — Encounter: Payer: Self-pay | Admitting: Gastroenterology

## 2022-09-28 NOTE — H&P (Signed)
Pre-Procedure H&P   Patient ID: Cindy Sandoval is a 72 y.o. female.  Gastroenterology Provider: Annamaria Helling, DO  Referring Provider: Dawson Bills, NP PCP: Sallee Lange, NP  Date: 09/29/2022  HPI Ms. Cindy Sandoval is a 72 y.o. female who presents today for Colonoscopy for screening; fhx colon cancer .  Pt with h/o hemorrhoids- itching is her main sx. Reports weight appetite have been stable. No change in BM, no M/H No phx polyps  Last csy 09/2016- fair prep, redundant, otherwise normal; rpt 5 y 03/2011- normal, IH  Fhx- crc- brother and sister (72s)  August 2023 ct- moderate stool burden most prominent in right colon; left diverticulosis noted  Most recent lab work 14.1 mcv 98 plt 306 cr 0.7   Past Medical History:  Diagnosis Date   Arthritis    OSTEOARTHRITIS   Asthma due to seasonal allergies    SEVERE sinus drainage. difficulty breathing if laying flat   Baker's cyst of knee    Cancer ([place])    basal cell   Dental root implant present    Diabetes mellitus without complication ([place])    TYPE I, USES INSULIN PUMP   Diabetic arthropathy ([place])    Family history of adverse reaction to anesthesia    sister n/v takes long time to wake up   Fatty liver    GERD (gastroesophageal reflux disease)    Glaucoma 2019   Hyperlipidemia    Hypertension    Hypertensive retinopathy    Insulin pump in place 08/2018   Microalbuminuria    Shingles 2014    Past Surgical History:  Procedure Laterality Date   BREAST BIOPSY Left 1982   neg   COLONOSCOPY     COLONOSCOPY WITH PROPOFOL N/A 10/20/2016   Procedure: COLONOSCOPY WITH PROPOFOL;  Surgeon: Lollie Sails, MD;  Location: [facility] ENDOSCOPY;  Service: Endoscopy;  Laterality: N/A;   ESOPHAGOGASTRODUODENOSCOPY     EYE SURGERY Bilateral    trabeculoplasty 06/2018   JOINT REPLACEMENT     KNEE ARTHROPLASTY Right 09/06/2018   Procedure: COMPUTER ASSISTED TOTAL KNEE ARTHROPLASTY;  Surgeon: Dereck Leep,  MD;  Location: [facility] ORS;  Service: Orthopedics;  Laterality: Right;   KNEE ARTHROPLASTY Left 05/09/2022   Procedure: COMPUTER ASSISTED TOTAL KNEE ARTHROPLASTY;  Surgeon: Dereck Leep, MD;  Location: [facility] ORS;  Service: Orthopedics;  Laterality: Left;   PANRETINAL PHOTOCOAGULATION Left 07/2018   POPLITEAL SYNOVIAL CYST EXCISION Left 2005   REFRACTIVE SURGERY Bilateral    blood vessels draining/bleeding . pressures still elevated   TOTAL KNEE ARTHROPLASTY Right     Family History Sister and brother - crc No h/o GI disease or malignancy  Review of Systems  Constitutional:  Negative for activity change, appetite change, chills, diaphoresis, fatigue, fever and unexpected weight change.  HENT:  Negative for trouble swallowing and voice change.   Respiratory:  Negative for shortness of breath and wheezing.   Cardiovascular:  Negative for chest pain, palpitations and leg swelling.  Gastrointestinal:  Negative for abdominal distention, abdominal pain, anal bleeding, blood in stool, constipation, diarrhea, nausea, rectal pain and vomiting.  Musculoskeletal:  Negative for arthralgias and myalgias.  Skin:  Negative for color change and pallor.  Neurological:  Negative for dizziness, syncope and weakness.  Psychiatric/Behavioral:  Negative for confusion.   All other systems reviewed and are negative.    Medications No current facility-administered medications on file prior to encounter.   Current Outpatient Medications on File Prior to Encounter  Medication Sig Dispense Refill   acetaminophen (TYLENOL) 500 MG tablet Take 1-2 tablets (500-1,000 mg total) by mouth every 6 (six) hours as needed. 60 tablet 0   amLODipine (NORVASC) 10 MG tablet Take 10 mg by mouth at bedtime.     aspirin EC 81 MG tablet Take 81 mg by mouth daily. Swallow whole.     Azelastine-Fluticasone 137-50 MCG/ACT SUSP Place 1 spray into both nostrils daily as needed.     celecoxib (CELEBREX) 200 MG capsule Take 1 capsule  (200 mg total) by mouth 2 (two) times daily. 30 capsule 0   cetirizine (ZYRTEC) 10 MG tablet Take 10 mg by mouth daily.     Cyanocobalamin (VITAMIN B12 PO) Take 2,500 mcg by mouth daily.     diclofenac sodium (VOLTAREN) 1 % GEL Apply 1 application topically 3 (three) times daily as needed (pain).      enoxaparin (LOVENOX) 40 MG/0.4ML injection Inject 0.4 mLs (40 mg total) into the skin daily. 5.6 mL 0   insulin aspart (NOVOLOG) 100 UNIT/ML injection Inject into the skin. Pt uses in INSULIN PUMP     Insulin Infusion Pump (T:SLIM G4 INSULIN PUMP) DEVI Inject into the skin.     losartan (COZAAR) 100 MG tablet Take 100 mg by mouth daily.     Melatonin 10 MG/ML LIQD Take 5 mg by mouth at bedtime as needed.     NON FORMULARY Take 1,000 mcg by mouth daily. Beet Root     omeprazole (PRILOSEC) 40 MG capsule Take 40 mg by mouth daily.     ondansetron (ZOFRAN) 4 MG tablet Take 1 tablet (4 mg total) by mouth every 6 (six) hours as needed for nausea. 30 tablet 0   oxyCODONE (OXY IR/ROXICODONE) 5 MG immediate release tablet Take 1-2 tablets (5-10 mg total) by mouth every 4 (four) hours as needed for severe pain. 30 tablet 0   pravastatin (PRAVACHOL) 20 MG tablet Take 20 mg by mouth at bedtime.      traMADol (ULTRAM) 50 MG tablet Take 1-2 tablets (50-100 mg total) by mouth every 4 (four) hours as needed for moderate pain. 30 tablet 0   insulin detemir (LEVEMIR) 100 UNIT/ML injection Inject into the skin. Uses as a backup if PUMP gives out (Patient not taking: Reported on 04/29/2022)      Pertinent medications related to GI and procedure were reviewed by me with the patient prior to the procedure   Current Facility-Administered Medications:    0.9 %  sodium chloride infusion, , Intravenous, Continuous, Annamaria Helling, DO, Last Rate: 20 mL/hr at 09/29/22 1038, Continued from Pre-op at 09/29/22 1038      Allergies  Allergen Reactions   Kiwi Extract Anaphylaxis   Septra [Sulfamethoxazole-Trimethoprim]  Nausea And Vomiting    Required patient to be hospitalized after taking this   Adhesive [Tape] Rash    tegaderm is okay. Broke out in rash around pump with plastic tape. USE TAPE SPARINGLY   Bactroban [Mupirocin Calcium] Swelling    Used around knee and caused swelling   Insulin Glargine Hives    lantus caused problem   Lisinopril Other (See Comments)    BODY ACHED EVERYWHERE. NO ENERGY    Simvastatin Other (See Comments)    BODY ACHED EVERYWHERE   Cephalexin Diarrhea   Dexilant [Dexlansoprazole] Other (See Comments)    Did not help digestive issues   Latanoprost     Other reaction(s): Other (See Comments) Causes ocular discomfort and burning   Mupirocin Swelling  Redness at site   Allergies were reviewed by me prior to the procedure  Objective   Body mass index is 32.85 kg/m. Vitals:   09/29/22 1002  BP: 121/65  Pulse: 67  Resp: 20  Temp: (!) 96.2 F (35.7 C)  TempSrc: Temporal  SpO2: 97%  Weight: 81.5 kg  Height: '5\' 2"'$  (1.575 m)     Physical Exam Vitals and nursing note reviewed.  Constitutional:      General: She is not in acute distress.    Appearance: Normal appearance. She is obese. She is not ill-appearing, toxic-appearing or diaphoretic.  HENT:     Head: Normocephalic and atraumatic.     Nose: Nose normal.     Mouth/Throat:     Mouth: Mucous membranes are moist.     Pharynx: Oropharynx is clear.  Eyes:     General: No scleral icterus.    Extraocular Movements: Extraocular movements intact.  Cardiovascular:     Rate and Rhythm: Normal rate and regular rhythm.     Heart sounds: Normal heart sounds. No murmur heard.    No friction rub. No gallop.  Pulmonary:     Effort: Pulmonary effort is normal. No respiratory distress.     Breath sounds: Normal breath sounds. No wheezing, rhonchi or rales.  Abdominal:     General: Bowel sounds are normal. There is no distension.     Palpations: Abdomen is soft.     Tenderness: There is no abdominal  tenderness. There is no guarding or rebound.  Musculoskeletal:     Cervical back: Neck supple.     Right lower leg: No edema.     Left lower leg: No edema.  Skin:    General: Skin is warm and dry.     Coloration: Skin is not jaundiced or pale.  Neurological:     General: No focal deficit present.     Mental Status: She is alert and oriented to person, place, and time. Mental status is at baseline.  Psychiatric:        Mood and Affect: Mood normal.        Behavior: Behavior normal.        Thought Content: Thought content normal.        Judgment: Judgment normal.      Assessment:  Ms. Cindy Sandoval is a 72 y.o. female  who presents today for Colonoscopy for screening; fhx colon cancer .  Plan:  Colonoscopy with possible intervention today  Colonoscopy with possible biopsy, control of bleeding, polypectomy, and interventions as necessary has been discussed with the patient/patient representative. Informed consent was obtained from the patient/patient representative after explaining the indication, nature, and risks of the procedure including but not limited to death, bleeding, perforation, missed neoplasm/lesions, cardiorespiratory compromise, and reaction to medications. Opportunity for questions was given and appropriate answers were provided. Patient/patient representative has verbalized understanding is amenable to undergoing the procedure.   Annamaria Helling, DO  [facility] Gastroenterology  Portions of the record may have been created with voice recognition software. Occasional wrong-word or 'sound-a-like' substitutions may have occurred due to the inherent limitations of voice recognition software.  Read the chart carefully and recognize, using context, where substitutions may have occurred.

## 2022-09-29 ENCOUNTER — Encounter
Admission: RE | Disposition: A | Discharge status: Home / Self Care | Payer: Self-pay | Source: Home / Self Care | Attending: Gastroenterology

## 2022-09-29 ENCOUNTER — Ambulatory Visit
Admission: RE | Admit: 2022-09-29 | Discharge: 2022-09-29 | Disposition: A | Discharge status: Home / Self Care | Payer: Medicare Other | Attending: Gastroenterology | Admitting: Gastroenterology

## 2022-09-29 ENCOUNTER — Encounter: Payer: Self-pay | Admitting: Gastroenterology

## 2022-09-29 ENCOUNTER — Ambulatory Visit: Payer: Medicare Other | Admitting: Certified Registered Nurse Anesthetist

## 2022-09-29 DIAGNOSIS — E785 Hyperlipidemia, unspecified: Secondary | ICD-10-CM | POA: Diagnosis not present

## 2022-09-29 DIAGNOSIS — D125 Benign neoplasm of sigmoid colon: Secondary | ICD-10-CM | POA: Insufficient documentation

## 2022-09-29 DIAGNOSIS — E109 Type 1 diabetes mellitus without complications: Secondary | ICD-10-CM | POA: Diagnosis not present

## 2022-09-29 DIAGNOSIS — Z794 Long term (current) use of insulin: Secondary | ICD-10-CM | POA: Insufficient documentation

## 2022-09-29 DIAGNOSIS — K5730 Diverticulosis of large intestine without perforation or abscess without bleeding: Secondary | ICD-10-CM | POA: Insufficient documentation

## 2022-09-29 DIAGNOSIS — Z9641 Presence of insulin pump (external) (internal): Secondary | ICD-10-CM | POA: Diagnosis not present

## 2022-09-29 DIAGNOSIS — D124 Benign neoplasm of descending colon: Secondary | ICD-10-CM | POA: Insufficient documentation

## 2022-09-29 DIAGNOSIS — D122 Benign neoplasm of ascending colon: Secondary | ICD-10-CM | POA: Insufficient documentation

## 2022-09-29 DIAGNOSIS — Z800 Family history of malignant neoplasm of digestive organs: Secondary | ICD-10-CM | POA: Diagnosis not present

## 2022-09-29 DIAGNOSIS — D123 Benign neoplasm of transverse colon: Secondary | ICD-10-CM | POA: Insufficient documentation

## 2022-09-29 DIAGNOSIS — K640 First degree hemorrhoids: Secondary | ICD-10-CM | POA: Diagnosis not present

## 2022-09-29 DIAGNOSIS — I10 Essential (primary) hypertension: Secondary | ICD-10-CM | POA: Diagnosis not present

## 2022-09-29 DIAGNOSIS — H409 Unspecified glaucoma: Secondary | ICD-10-CM | POA: Insufficient documentation

## 2022-09-29 DIAGNOSIS — K760 Fatty (change of) liver, not elsewhere classified: Secondary | ICD-10-CM | POA: Insufficient documentation

## 2022-09-29 DIAGNOSIS — J45909 Unspecified asthma, uncomplicated: Secondary | ICD-10-CM | POA: Diagnosis not present

## 2022-09-29 DIAGNOSIS — K219 Gastro-esophageal reflux disease without esophagitis: Secondary | ICD-10-CM | POA: Insufficient documentation

## 2022-09-29 DIAGNOSIS — Z1211 Encounter for screening for malignant neoplasm of colon: Secondary | ICD-10-CM | POA: Diagnosis not present

## 2022-09-29 DIAGNOSIS — M1990 Unspecified osteoarthritis, unspecified site: Secondary | ICD-10-CM | POA: Insufficient documentation

## 2022-09-29 HISTORY — PX: COLONOSCOPY: SHX5424

## 2022-09-29 HISTORY — DX: Hypertensive retinopathy, unspecified eye: H35.039

## 2022-09-29 LAB — GLUCOSE, CAPILLARY: Glucose-Capillary: 135 mg/dL — ABNORMAL HIGH (ref 70–99)

## 2022-09-29 SURGERY — COLONOSCOPY
Anesthesia: General

## 2022-09-29 MED ORDER — PROPOFOL 10 MG/ML IV BOLUS
INTRAVENOUS | Status: DC | PRN
  Administered 2022-09-29: 10 mg via INTRAVENOUS
  Administered 2022-09-29: 60 mg via INTRAVENOUS
  Administered 2022-09-29: 20 mg via INTRAVENOUS

## 2022-09-29 MED ORDER — PROPOFOL 500 MG/50ML IV EMUL
INTRAVENOUS | Status: DC | PRN
  Administered 2022-09-29: 160 ug/kg/min via INTRAVENOUS

## 2022-09-29 MED ORDER — SODIUM CHLORIDE 0.9 % IV SOLN
INTRAVENOUS | Status: DC
  Administered 2022-09-29: 20 mL/h via INTRAVENOUS

## 2022-09-29 NOTE — Anesthesia Procedure Notes (Signed)
Date/Time: 09/29/2022 10:51 AM  Performed by: Demetrius Charity, CRNAPre-anesthesia Checklist: Patient identified, Emergency Drugs available, Suction available, Patient being monitored and Timeout performed Patient Re-evaluated:Patient Re-evaluated prior to induction Oxygen Delivery Method: Nasal cannula Induction Type: IV induction Placement Confirmation: CO2 detector and positive ETCO2

## 2022-09-29 NOTE — Op Note (Signed)
[facility] Gastroenterology Patient Name: Cindy Sandoval Procedure Date: 09/29/2022 10:37 AM MRN: [id number] Account #: [id number] Date of Birth: 04/14/1951 Admit Type: Outpatient Age: 72 Room: [facility] ENDO ROOM 2 Gender: Female Note Status: Finalized Instrument Name: Jasper Riling [id number] Procedure:             Colonoscopy Indications:           Screening patient at increased risk: Family history of                         colorectal cancer in multiple 1st-degree relatives Providers:             Rueben Bash, DO Referring MD:          Juluis Rainier (Referring MD) Medicines:             Monitored Anesthesia Care Complications:         No immediate complications. Estimated blood loss:                         Minimal. Procedure:             Pre-Anesthesia Assessment:                        - Prior to the procedure, a History and Physical was                         performed, and patient medications and allergies were                         reviewed. The patient is competent. The risks and                         benefits of the procedure and the sedation options and                         risks were discussed with the patient. All questions                         were answered and informed consent was obtained.                         Patient identification and proposed procedure were                         verified by the physician, the nurse, the anesthetist                         and the technician in the endoscopy suite. Mental                         Status Examination: alert and oriented. Airway                         Examination: normal oropharyngeal airway and neck                         mobility. Respiratory Examination: clear to  auscultation. CV Examination: RRR, no murmurs, no S3                         or S4. Prophylactic Antibiotics: The patient does not                         require prophylactic antibiotics.  Prior                         Anticoagulants: The patient has taken no anticoagulant                         or antiplatelet agents. ASA Grade Assessment: III - A                         patient with severe systemic disease. After reviewing                         the risks and benefits, the patient was deemed in                         satisfactory condition to undergo the procedure. The                         anesthesia plan was to use monitored anesthesia care                         (MAC). Immediately prior to administration of                         medications, the patient was re-assessed for adequacy                         to receive sedatives. The heart rate, respiratory                         rate, oxygen saturations, blood pressure, adequacy of                         pulmonary ventilation, and response to care were                         monitored throughout the procedure. The physical                         status of the patient was re-assessed after the                         procedure.                        After obtaining informed consent, the colonoscope was                         passed under direct vision. Throughout the procedure,                         the patient's blood pressure, pulse, and oxygen  saturations were monitored continuously. The                         Colonoscope was introduced through the anus and                         advanced to the the cecum, identified by appendiceal                         orifice and ileocecal valve. The colonoscopy was                         performed without difficulty. The patient tolerated                         the procedure well. The quality of the bowel                         preparation was evaluated using the BBPS [facility] Bowel                         Preparation Scale) with scores of: Right Colon = 2                         (minor amount of residual staining, small fragments of                          stool and/or opaque liquid, but mucosa seen well),                         Transverse Colon = 3 (entire mucosa seen well with no                         residual staining, small fragments of stool or opaque                         liquid) and Left Colon = 3 (entire mucosa seen well                         with no residual staining, small fragments of stool or                         opaque liquid). The total BBPS score equals 8. The                         quality of the bowel preparation was excellent. The                         ileocecal valve, appendiceal orifice, and rectum were                         photographed. Findings:      The perianal and digital rectal examinations were normal. Pertinent       negatives include normal sphincter tone.      Three sessile polyps were found in the descending colon and ascending       colon (2). The polyps were 4 to  11 mm in size- (one of the ascending       polyps >36m). These polyps were removed with a cold snare. Resection       and retrieval were complete. Estimated blood loss was minimal.      Four sessile polyps were found in the sigmoid colon, transverse colon       (2) and ascending colon. The polyps were 1 to 2 mm in size. These polyps       were removed with a jumbo cold forceps. Resection and retrieval were       complete. Estimated blood loss was minimal.      Multiple small-mouthed diverticula were found in the sigmoid colon.       Estimated blood loss: none.      Non-bleeding internal hemorrhoids were found during retroflexion. The       hemorrhoids were Grade I (internal hemorrhoids that do not prolapse).       Estimated blood loss: none.      Retroflexion in the right colon was performed.      The exam was otherwise without abnormality on direct and retroflexion       views. Impression:            - Three 4 to 11 mm polyps in the descending colon and                         in the ascending colon, removed  with a cold snare.                         Resected and retrieved.                        - Four 1 to 2 mm polyps in the sigmoid colon, in the                         transverse colon and in the ascending colon, removed                         with a jumbo cold forceps. Resected and retrieved.                        - Diverticulosis in the sigmoid colon.                        - Non-bleeding internal hemorrhoids.                        - The examination was otherwise normal on direct and                         retroflexion views. Recommendation:        - Patient has a contact number available for                         emergencies. The signs and symptoms of potential                         delayed complications were discussed with the patient.  Return to normal activities tomorrow. Written                         discharge instructions were provided to the patient.                        - Discharge patient to home.                        - Resume previous diet.                        - Continue present medications.                        - No aspirin, ibuprofen, naproxen, or other                         non-steroidal anti-inflammatory drugs for 5 days after                         polyp removal.                        - Await pathology results.                        - Repeat colonoscopy in 3 years for surveillance based                         on pathology results.                        - Return to referring physician as previously                         scheduled.                        - The findings and recommendations were discussed with                         the patient. Procedure Code(s):     --- Professional ---                        [phone], Colonoscopy, flexible; with removal of                         tumor(s), polyp(s), or other lesion(s) by snare                         technique                        45380, 62, Colonoscopy, flexible; with  biopsy, single                         or multiple Diagnosis Code(s):     --- Professional ---                        Z80.0, Family history of malignant neoplasm of  digestive organs                        D12.4, Benign neoplasm of descending colon                        D12.5, Benign neoplasm of sigmoid colon                        D12.3, Benign neoplasm of transverse colon (hepatic                         flexure or splenic flexure)                        D12.2, Benign neoplasm of ascending colon                        K64.0, First degree hemorrhoids                        K57.30, Diverticulosis of large intestine without                         perforation or abscess without bleeding CPT copyright 2022 American Medical Association. All rights reserved. The codes documented in this report are preliminary and upon coder review may  be revised to meet current compliance requirements. Attending Participation:      I personally performed the entire procedure. Volney American, DO Annamaria Helling DO, DO 09/29/2022 12:05:24 PM This report has been signed electronically. Number of Addenda: 0 Note Initiated On: 09/29/2022 10:37 AM Scope Withdrawal Time: 0 hours 24 minutes 9 seconds  Total Procedure Duration: 0 hours 33 minutes 29 seconds  Estimated Blood Loss:  Estimated blood loss was minimal.      [facility]

## 2022-09-29 NOTE — Interval H&P Note (Signed)
History and Physical Interval Note: Preprocedure H&P from 09/29/22  was reviewed and there was no interval change after seeing and examining the patient.  Written consent was obtained from the patient after discussion of risks, benefits, and alternatives. Patient has consented to proceed with Colonoscopy with possible intervention   09/29/2022 10:49 AM  Cindy Sandoval  has presented today for surgery, with the diagnosis of Hx of adenomatous colonic polyps (Z86.010).  The various methods of treatment have been discussed with the patient and family. After consideration of risks, benefits and other options for treatment, the patient has consented to  Procedure(s): COLONOSCOPY (N/A) as a surgical intervention.  The patient's history has been reviewed, patient examined, no change in status, stable for surgery.  I have reviewed the patient's chart and labs.  Questions were answered to the patient's satisfaction.     Annamaria Helling

## 2022-09-29 NOTE — Anesthesia Preprocedure Evaluation (Signed)
Anesthesia Evaluation  Patient identified by MRN, date of birth, ID band Patient awake    Reviewed: Allergy & Precautions, H&P , NPO status , Patient's Chart, lab work & pertinent test results, reviewed documented beta blocker date and time   Airway Mallampati: II   Neck ROM: full    Dental  (+) Poor Dentition   Pulmonary asthma    Pulmonary exam normal        Cardiovascular Exercise Tolerance: Good hypertension, On Medications negative cardio ROS Normal cardiovascular exam Rhythm:regular Rate:Normal     Neuro/Psych negative neurological ROS  negative psych ROS   GI/Hepatic Neg liver ROS,GERD  Medicated,,  Endo/Other  negative endocrine ROSdiabetes, Well Controlled    Renal/GU negative Renal ROS  negative genitourinary   Musculoskeletal   Abdominal   Peds  Hematology negative hematology ROS (+)   Anesthesia Other Findings Past Medical History: No date: Arthritis     Comment:  OSTEOARTHRITIS No date: Asthma due to seasonal allergies     Comment:  SEVERE sinus drainage. difficulty breathing if laying               flat No date: Baker's cyst of knee No date: Cancer [facility])     Comment:  basal cell No date: Dental root implant present No date: Diabetes mellitus without complication ([place])     Comment:  TYPE I, USES INSULIN PUMP No date: Diabetic arthropathy ([facility]) No date: Family history of adverse reaction to anesthesia     Comment:  sister n/v takes long time to wake up No date: Fatty liver No date: GERD (gastroesophageal reflux disease) 2019: Glaucoma No date: Hyperlipidemia No date: Hypertension No date: Hypertensive retinopathy 08/2018: Insulin pump in place No date: Microalbuminuria 2014: Shingles Past Surgical History: 1982: BREAST BIOPSY; Left     Comment:  neg No date: COLONOSCOPY 10/20/2016: COLONOSCOPY WITH PROPOFOL; N/A     Comment:  Procedure: COLONOSCOPY WITH PROPOFOL;  Surgeon: Lollie Sails, MD;  Location: [facility] ENDOSCOPY;  Service:               Endoscopy;  Laterality: N/A; No date: ESOPHAGOGASTRODUODENOSCOPY No date: EYE SURGERY; Bilateral     Comment:  trabeculoplasty 06/2018 No date: JOINT REPLACEMENT 09/06/2018: KNEE ARTHROPLASTY; Right     Comment:  Procedure: COMPUTER ASSISTED TOTAL KNEE ARTHROPLASTY;                Surgeon: Dereck Leep, MD;  Location: [facility] ORS;                Service: Orthopedics;  Laterality: Right; 05/09/2022: KNEE ARTHROPLASTY; Left     Comment:  Procedure: COMPUTER ASSISTED TOTAL KNEE ARTHROPLASTY;                Surgeon: Dereck Leep, MD;  Location: [facility] ORS;                Service: Orthopedics;  Laterality: Left; 07/2018: PANRETINAL PHOTOCOAGULATION; Left 2005: POPLITEAL SYNOVIAL CYST EXCISION; Left No date: REFRACTIVE SURGERY; Bilateral     Comment:  blood vessels draining/bleeding . pressures still               elevated No date: TOTAL KNEE ARTHROPLASTY; Right BMI    Body Mass Index: 32.85 kg/m     Reproductive/Obstetrics negative OB ROS  Anesthesia Physical Anesthesia Plan  ASA: 3  Anesthesia Plan: General   Post-op Pain Management:    Induction:   PONV Risk Score and Plan:   Airway Management Planned:   Additional Equipment:   Intra-op Plan:   Post-operative Plan:   Informed Consent: I have reviewed the patients History and Physical, chart, labs and discussed the procedure including the risks, benefits and alternatives for the proposed anesthesia with the patient or authorized representative who has indicated his/her understanding and acceptance.     Dental Advisory Given  Plan Discussed with: CRNA  Anesthesia Plan Comments:        Anesthesia Quick Evaluation

## 2022-09-29 NOTE — Transfer of Care (Signed)
Immediate Anesthesia Transfer of Care Note  Patient: Cindy Sandoval  Procedure(s) Performed: COLONOSCOPY  Patient Location: PACU  Anesthesia Type:General  Level of Consciousness: drowsy  Airway & Oxygen Therapy: Patient Spontanous Breathing and Patient connected to nasal cannula oxygen  Post-op Assessment: Report given to RN and Post -op Vital signs reviewed and stable  Post vital signs: Reviewed and stable  Last Vitals:  Vitals Value Taken Time  BP    Temp    Pulse    Resp    SpO2      Last Pain:  Vitals:   09/29/22 1002  TempSrc: Temporal  PainSc: 0-No pain         Complications: No notable events documented.

## 2022-09-30 ENCOUNTER — Encounter: Payer: Self-pay | Admitting: Gastroenterology

## 2022-09-30 LAB — SURGICAL PATHOLOGY

## 2022-09-30 NOTE — Anesthesia Postprocedure Evaluation (Signed)
Anesthesia Post Note  Patient: Cindy Sandoval  Procedure(s) Performed: COLONOSCOPY  Patient location during evaluation: PACU Anesthesia Type: General Level of consciousness: awake and alert Pain management: pain level controlled Vital Signs Assessment: post-procedure vital signs reviewed and stable Respiratory status: spontaneous breathing, nonlabored ventilation, respiratory function stable and patient connected to nasal cannula oxygen Cardiovascular status: blood pressure returned to baseline and stable Postop Assessment: no apparent nausea or vomiting Anesthetic complications: no   No notable events documented.   Last Vitals:  Vitals:   09/29/22 1142 09/29/22 1152  BP: 97/81   Pulse: 73 68  Resp:    Temp:    SpO2: 100% 98%    Last Pain:  Vitals:   09/30/22 0733  TempSrc:   PainSc: 0-No pain                 Molli Barrows

## 2022-11-12 ENCOUNTER — Ambulatory Visit: Payer: Self-pay | Admitting: General Surgery

## 2022-11-12 NOTE — H&P (Signed)
PATIENT PROFILE: Cindy Sandoval is a 72 y.o. female who presents to the Clinic for consultation at the request of Dr. Jerelene Redden for evaluation of umbilical hernia.  PCP:  Sallee Lange, NP  HISTORY OF PRESENT ILLNESS: [name] reports she has been feeling a bulge in his umbilical area since a month ago.  She endorses that the bulge come out and is painful.  Pain localized to the paramedical area.  No pain radiation.  Pain aggravated by applying pressure.  No aggravating factors.  Patient denies any episode of abdominal distention, nausea or vomiting.  I personally evaluated CT scan done on 04/10/2022.  This was consistent with umbilical hernia.  No other intra-abdominal pathology was identified.  I personally evaluated the images.   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 Formatting of this note might be different from the original. On insulin pump therapy.  Followed by Dr. Eddie Dibbles.      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  Followed by Dr. Eddie Dibbles.      Hyperlipidemia  associated with type 1 diabetes mellitus (CMS-HCC) Unknown   Baker's cyst of knee 06/16/2013   GERD (gastroesophageal reflux disease) 06/07/2013   Hypertension associated with diabetes (CMS-HCC) 06/07/2013   Osteoarthritis 05/19/2012   Overview    a. Extensive work-up for secondary causes negative.  b. HCQ 4 1/2 months 2008, ineffective  c. Doxycycline spring 2011 effective, stopped for side effects after 1 mo.       Diabetic arthropathy (CMS-HCC) 05/19/2012    GENERAL REVIEW OF SYSTEMS:   General ROS: negative for - chills, fatigue, fever, weight gain or weight loss Allergy and Immunology ROS: negative for - hives  Hematological and Lymphatic ROS: negative for - bleeding problems or bruising, negative for palpable nodes Endocrine ROS: negative for - heat or cold intolerance, hair changes Respiratory ROS: negative for - cough, shortness of breath or wheezing Cardiovascular ROS: no chest pain or palpitations GI ROS: negative for nausea, vomiting, abdominal pain, diarrhea, constipation Musculoskeletal ROS: negative for - joint swelling or muscle pain Neurological ROS: negative for - confusion, syncope Dermatological ROS: negative for pruritus and rash Psychiatric: negative for anxiety, depression, difficulty sleeping and memory loss  MEDICATIONS: Current Outpatient Medications  Medication Sig Dispense Refill   acetaminophen (TYLENOL) 500 MG tablet Take 1,000 mg by mouth every 6 (six) hours as needed for Pain     amLODIPine (NORVASC) 10 MG tablet TAKE 1 TABLET BY MOUTH EVERY DAY 90 tablet 2   aspirin 81 mg tablet 1 tab by mouth daily     dorzolamide-timoloL (COSOPT) 22.3-6.8 mg/mL ophthalmic solution  Place 1 drop into both eyes 2 (two) times daily 10 mL 12   insulin ASPART (NOVOLOG U-100 INSULIN ASPART) injection (concentration 100 units/mL) INJECT UP TO 60 UNITS VIA PUMP AS DIRECTED    E10.29 60 mL 3   losartan (COZAAR) 100 MG tablet TAKE 1 TABLET BY MOUTH EVERY DAY 90 tablet 3    mecobalamin (B12 ACTIVE ORAL) Take 25 mcg by mouth once daily     melatonin 1 mg/4 mL Drop Take 2 drops by mouth nightly as needed     omeprazole (PRILOSEC) 40 MG DR capsule TAKE 1 CAPSULE BY MOUTH EVERY DAY 90 capsule 1   pravastatin (PRAVACHOL) 20 MG tablet TAKE 1 TABLET BY MOUTH EVERY DAY 90 tablet 3   T:SLIM G4 INSULIN PUMP Misc by Continuous Sub-Q Infusn (via wearable injector) route as directed     No current facility-administered medications for this visit.    ALLERGIES: Kiwi, Insulin glargine, Mupirocin calcium, Adhesive, Bactroban [mupirocin], Cephalexin, Dexlansoprazole, Latanoprost, Lisinopril, Septra [sulfamethoxazole-trimethoprim], and Simvastatin  PAST MEDICAL HISTORY: Past Medical History:  Diagnosis Date   Adhesive capsulitis    Ankle fracture April 8th, 2016   Left foot   Asthma    Baker's cyst of knee    left   Basal cell carcinoma    Cancer (CMS-HCC)    basal cell left eyelid   Chicken pox    COVID-19 09/2019   Depression    Diabetes mellitus type 1 (CMS-HCC)    On insulin pump therapy.  Followed by Dr. Eddie Dibbles.   Diabetic arthropathy (CMS-HCC)    Diabetic retinopathy associated with type 2 diabetes mellitus (CMS-HCC)    Family history of adverse reaction to anesthesia    Fatty liver    Fibrocystic breast disease    Fibroid    GERD (gastroesophageal reflux disease)    Glaucoma    Head injury 12/01/2014   Hyperlipidemia    Hypertension    Ocular hypertension    Osteoarthritis 05/19/2012   a. Extensive work-up for secondary causes negative.  b. HCQ 4 1/2 months 2008, ineffective  c. Doxycycline spring 2011 effective, stopped for side effects after 1 mo.     Osteoporosis    PCO (posterior capsular opacification), bilateral    Pseudophakia of both eyes    Recurrent herpes simplex    Redundant colon 10/20/2016   Shingles 2014   UTI (urinary tract infection)     PAST SURGICAL HISTORY: Past Surgical History:  Procedure Laterality Date   BREAST SURGERY  Left 1982   biopsy   Basal cell removed  2001   POPLITEAL SYNOVIAL CYST EXCISION Left 2005   PCIOL OU  2012   COLONOSCOPY  04/11/2011   MUS- NO POLYPS/FMHX CCA  REPEAT 5 YRS   FUNCTIONAL ENDOSCOPIC SINUS SURGERY  01/2012   ARTHROSCOPY KNEE W/DEBRIDEMENT/SHAVING ARTICULAR CARTILAGE Left 06/23/2013   Procedure: ARTHROSCOPY KNEE W/DEBRIDEMENT/SHAVING ARTICULAR CARTILAGE;  Surgeon: Amador Cunas, MD;  Location: [facility];  Service: Orthopedics;  Laterality: Left;   UNLISTED ARTHROSCOPY PROCEDURE Left 06/23/2013   Procedure: Arthroscopic Baker's Cystectomy;  Surgeon: Amador Cunas, MD;  Location: [facility];  Service: Orthopedics;  Laterality: Left;   ARTHROSCOPY KNEE W/SYNOVECTOMY Left 06/23/2013   Procedure: ARTHROSCOPY KNEE W/SYNOVECTOMY;  Surgeon: Amador Cunas, MD;  Location: [facility];  Service: Orthopedics;  Laterality: Left;   ARTHROSCOPY KNEE W/MENISCECTOMY Left 06/23/2013   Procedure: ARTHROSCOPY, KNEE, SURGICAL; WITH MENISCECTOMY (MEDIAL OR LATERAL, INCLUDING ANY MENISCAL SHAVING) INCLUDING DEBRIDEMENT/SHAVING ARTICULAR CARTILAGE, SAME OR SEPARATE COMPARTMENT,  WHEN PERFORMED;  Surgeon: Amador Cunas, MD;  Location: [facility];  Service: Orthopedics;  Laterality: Left;   UPPER GASTROINTESTINAL ENDOSCOPY  12/12/2013   MUS   EGD  12/12/2013   MUS @ [place] - no rpt per MUS   COLONOSCOPY  10/20/2016   Redundant colon/ Entire examined colon is normal/FHx colon cancer-Sister/Repeat 18yrs/MUS   Right total knee arthroplasty using computer-assisted navigation  09/06/2018   Dr Marry Guan   Left total knee arthroplasty using computer-assisted navigation  05/09/2022   Dr Marry Guan   Colon @ [facility]  09/29/2022   Tubular adenoma/SSP/FHx CC/Repeat 35yrs/SMR   CATARACT EXTRACTION     EYELID SURGERY Bilateral    Blepharoplasty   EYELID SURGERY Right    Basal cell carcinoma   GLAUCOMA EYE SURGERY Bilateral    SLT   LENS EYE SURGERY Bilateral    CEIOL   VITREOUS RETINAL SURGERY Right    PRP      FAMILY HISTORY: Family History  Problem Relation Age of Onset   Heart disease Mother    Diabetes type II Mother    High blood pressure (Hypertension) Mother    Stroke Mother    Diabetes type II Father    Heart disease Father    Myocardial Infarction (Heart attack) Father    High blood pressure (Hypertension) Father    Colon cancer Sister    Diabetes type II Sister    Dementia Sister    No Known Problems Sister    Diabetes type II Brother    Colon cancer Brother    Brain cancer Brother    Brain cancer Brother    Cancer Brother        Unknown type   Heart disease Daughter    Heart disease Maternal Grandfather    High blood pressure (Hypertension) Maternal Grandfather    Diabetes type II Maternal Grandfather    Anesthesia problems Neg Hx    Malignant hyperthermia Neg Hx    Hypothyroidism Neg Hx    Glaucoma Neg Hx    Macular degeneration Neg Hx    Breast cancer Neg Hx      SOCIAL HISTORY: Social History   Socioeconomic History   Marital status: Married    Spouse name: Jimmy   Number of children: 1   Years of education: GED   Highest education level: GED or equivalent  Occupational History   Occupation: Retired  Tobacco Use   Smoking status: Never   Smokeless tobacco: Never  Vaping Use   Vaping Use: Never used  Substance and Sexual Activity   Alcohol use: Yes    Alcohol/week: 2.0 standard drinks of alcohol    Types: 2 Glasses of wine per week    Comment: Daily   Drug use: No   Sexual activity: Not Currently    Partners: Male    Birth control/protection: Post-menopausal    PHYSICAL EXAM: Vitals:   11/11/22 1021  BP: 136/71  Pulse: 68   Body mass index is 34.39 kg/m. Weight: 82.6 kg (182 lb)   GENERAL: Alert, active, oriented x3  HEENT: Pupils equal reactive to light. Extraocular movements are intact. Sclera clear. Palpebral conjunctiva normal red color.Pharynx clear.  NECK: Supple with no palpable mass and no adenopathy.  LUNGS: Sound clear  with no rales rhonchi or wheezes.  HEART: Regular rhythm S1 and S2 without murmur.  ABDOMEN: Soft and depressible, nontender with no palpable mass, no hepatomegaly.  Umbilical hernia.  Partial incarcerated.  Unable to completely reduce.  EXTREMITIES:  Well-developed well-nourished symmetrical with no dependent edema.  NEUROLOGICAL: Awake alert oriented, facial expression symmetrical, moving all extremities.  REVIEW OF DATA: I have reviewed the following data today: Initial consult on 11/11/2022  Component Date Value   WBC (White Blood Cell Co* 11/11/2022 8.0    RBC (Red Blood Cell Coun* 11/11/2022 4.43    Hemoglobin 11/11/2022 13.9    Hematocrit 11/11/2022 41.9    MCV (Mean Corpuscular Vo* 11/11/2022 94.6    MCH (Mean Corpuscular He* 11/11/2022 31.4 (H)    MCHC (Mean Corpuscular H* 11/11/2022 33.2    Platelet Count 11/11/2022 339    RDW-CV (Red Cell Distrib* 11/11/2022 14.7    MPV (Mean Platelet Volum* 11/11/2022 8.9 (L)    Neutrophils 11/11/2022 5.30    Lymphocytes 11/11/2022 1.81    Monocytes 11/11/2022 0.59    Eosinophils 11/11/2022 0.22    Basophils 11/11/2022 0.04    Neutrophil % 11/11/2022 66.2    Lymphocyte % 11/11/2022 22.6    Monocyte % 11/11/2022 7.4    Eosinophil % 11/11/2022 2.8    Basophil% 11/11/2022 0.5    Immature Granulocyte % 11/11/2022 0.5    Immature Granulocyte Cou* 11/11/2022 0.04    Glucose 11/11/2022 154 (H)    Sodium 11/11/2022 133 (L)    Potassium 11/11/2022 5.3 (H)    Chloride 11/11/2022 100    Carbon Dioxide (CO2) 11/11/2022 28.7    Urea Nitrogen (BUN) 11/11/2022 14    Creatinine 11/11/2022 0.6    Glomerular Filtration Ra* 11/11/2022 97    Calcium 11/11/2022 9.3    AST  11/11/2022 15    ALT  11/11/2022 13    Alk Phos (alkaline Phosp* 11/11/2022 56    Albumin 11/11/2022 4.0    Bilirubin, Total 11/11/2022 0.4    Protein, Total 11/11/2022 7.1    A/G Ratio 11/11/2022 1.3      ASSESSMENT: Ms. Harju is a 72 y.o. female presenting for  consultation for umbilical hernia.    The patient presents with a symptomatic umbilical hernia. Patient was oriented about the diagnosis of umbilical hernia and its implication. The patient was oriented about the treatment alternatives (observation vs surgical repair). Due to patient symptoms, repair is recommended. Patient oriented about the surgical procedure, the use of mesh and its risk of complications such as: infection, bleeding, injury to vasculature, injury to bowel or bladder, and chronic pain, intestinal obstruction, among others.   Umbilical hernia, incarcerated [K42.0]  PLAN: Robotic assisted laparoscopic umbilical hernia repair with mesh [id number]) CBC, CMP Hold aspirin 5 days before surgery Contact us if you have any concern.   Patient verbalized understanding, all questions were answered, and were agreeable with the plan outlined above.    Herbert Pun, MD  Electronically signed by Herbert Pun, MD

## 2022-11-12 NOTE — H&P (View-Only) (Signed)
PATIENT PROFILE: Cindy Sandoval is a 71 y.o. female who presents to the Clinic for consultation at the request of Cindy Sandoval for evaluation of umbilical hernia.  PCP:  Sandoval, Cindy Kathryn, NP  HISTORY OF PRESENT ILLNESS: Cindy Sandoval reports she has been feeling a bulge in his umbilical area since a month ago.  She endorses that the bulge come out and is painful.  Pain localized to the paramedical area.  No pain radiation.  Pain aggravated by applying pressure.  No aggravating factors.  Patient denies any episode of abdominal distention, nausea or vomiting.  I personally evaluated CT scan done on 04/10/2022.  This was consistent with umbilical hernia.  No other intra-abdominal pathology was identified.  I personally evaluated the images.   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 Formatting of this note might be different from the original. On insulin pump therapy.  Followed by Cindy Sandoval.      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  Followed by Cindy Sandoval.      Hyperlipidemia  associated with type 1 diabetes mellitus (CMS-HCC) Unknown   Baker's cyst of knee 06/16/2013   GERD (gastroesophageal reflux disease) 06/07/2013   Hypertension associated with diabetes (CMS-HCC) 06/07/2013   Osteoarthritis 05/19/2012   Overview    a. Extensive work-up for secondary causes negative.  b. HCQ 4 1/2 months 2008, ineffective  c. Doxycycline spring 2011 effective, stopped for side effects after 1 mo.       Diabetic arthropathy (CMS-HCC) 05/19/2012    GENERAL REVIEW OF SYSTEMS:   General ROS: negative for - chills, fatigue, fever, weight gain or weight loss Allergy and Immunology ROS: negative for - hives  Hematological and Lymphatic ROS: negative for - bleeding problems or bruising, negative for palpable nodes Endocrine ROS: negative for - heat or cold intolerance, hair changes Respiratory ROS: negative for - cough, shortness of breath or wheezing Cardiovascular ROS: no chest pain or palpitations GI ROS: negative for nausea, vomiting, abdominal pain, diarrhea, constipation Musculoskeletal ROS: negative for - joint swelling or muscle pain Neurological ROS: negative for - confusion, syncope Dermatological ROS: negative for pruritus and rash Psychiatric: negative for anxiety, depression, difficulty sleeping and memory loss  MEDICATIONS: Current Outpatient Medications  Medication Sig Dispense Refill   acetaminophen (TYLENOL) 500 MG tablet Take 1,000 mg by mouth every 6 (six) hours as needed for Pain     amLODIPine (NORVASC) 10 MG tablet TAKE 1 TABLET BY MOUTH EVERY DAY 90 tablet 2   aspirin 81 mg tablet 1 tab by mouth daily     dorzolamide-timoloL (COSOPT) 22.3-6.8 mg/mL ophthalmic solution   Place 1 drop into both eyes 2 (two) times daily 10 mL 12   insulin ASPART (NOVOLOG U-100 INSULIN ASPART) injection (concentration 100 units/mL) INJECT UP TO 60 UNITS VIA PUMP AS DIRECTED    E10.29 60 mL 3   losartan (COZAAR) 100 MG tablet TAKE 1 TABLET BY MOUTH EVERY DAY 90 tablet 3    mecobalamin (B12 ACTIVE ORAL) Take 25 mcg by mouth once daily     melatonin 1 mg/4 mL Drop Take 2 drops by mouth nightly as needed     omeprazole (PRILOSEC) 40 MG DR capsule TAKE 1 CAPSULE BY MOUTH EVERY DAY 90 capsule 1   pravastatin (PRAVACHOL) 20 MG tablet TAKE 1 TABLET BY MOUTH EVERY DAY 90 tablet 3   T:SLIM G4 INSULIN PUMP Misc by Continuous Sub-Q Infusn (via wearable injector) route as directed     No current facility-administered medications for this visit.    ALLERGIES: Kiwi, Insulin glargine, Mupirocin calcium, Adhesive, Bactroban [mupirocin], Cephalexin, Dexlansoprazole, Latanoprost, Lisinopril, Septra [sulfamethoxazole-trimethoprim], and Simvastatin  PAST MEDICAL HISTORY: Past Medical History:  Diagnosis Date   Adhesive capsulitis    Ankle fracture April 8th, 2016   Left foot   Asthma    Baker's cyst of knee    left   Basal cell carcinoma    Cancer (CMS-HCC)    basal cell left eyelid   Chicken pox    COVID-19 09/2019   Depression    Diabetes mellitus type 1 (CMS-HCC)    On insulin pump therapy.  Followed by Cindy Sandoval.   Diabetic arthropathy (CMS-HCC)    Diabetic retinopathy associated with type 2 diabetes mellitus (CMS-HCC)    Family history of adverse reaction to anesthesia    Fatty liver    Fibrocystic breast disease    Fibroid    GERD (gastroesophageal reflux disease)    Glaucoma    Head injury 12/01/2014   Hyperlipidemia    Hypertension    Ocular hypertension    Osteoarthritis 05/19/2012   a. Extensive work-up for secondary causes negative.  b. HCQ 4 1/2 months 2008, ineffective  c. Doxycycline spring 2011 effective, stopped for side effects after 1 mo.     Osteoporosis    PCO (posterior capsular opacification), bilateral    Pseudophakia of both eyes    Recurrent herpes simplex    Redundant colon 10/20/2016   Shingles 2014   UTI (urinary tract infection)     PAST SURGICAL HISTORY: Past Surgical History:  Procedure Laterality Date   BREAST SURGERY  Left 1982   biopsy   Basal cell removed  2001   POPLITEAL SYNOVIAL CYST EXCISION Left 2005   PCIOL OU  2012   COLONOSCOPY  04/11/2011   MUS- NO POLYPS/FMHX CCA  REPEAT 5 YRS   FUNCTIONAL ENDOSCOPIC SINUS SURGERY  01/2012   ARTHROSCOPY KNEE W/DEBRIDEMENT/SHAVING ARTICULAR CARTILAGE Left 06/23/2013   Procedure: ARTHROSCOPY KNEE W/DEBRIDEMENT/SHAVING ARTICULAR CARTILAGE;  Surgeon: Alison Patricia Toth, MD;  Location: [facility];  Service: Orthopedics;  Laterality: Left;   UNLISTED ARTHROSCOPY PROCEDURE Left 06/23/2013   Procedure: Arthroscopic Baker's Cystectomy;  Surgeon: Alison Patricia Toth, MD;  Location: [facility];  Service: Orthopedics;  Laterality: Left;   ARTHROSCOPY KNEE W/SYNOVECTOMY Left 06/23/2013   Procedure: ARTHROSCOPY KNEE W/SYNOVECTOMY;  Surgeon: Alison Patricia Toth, MD;  Location: [facility];  Service: Orthopedics;  Laterality: Left;   ARTHROSCOPY KNEE W/MENISCECTOMY Left 06/23/2013   Procedure: ARTHROSCOPY, KNEE, SURGICAL; WITH MENISCECTOMY (MEDIAL OR LATERAL, INCLUDING ANY MENISCAL SHAVING) INCLUDING DEBRIDEMENT/SHAVING ARTICULAR CARTILAGE, SAME OR SEPARATE COMPARTMENT,   WHEN PERFORMED;  Surgeon: Alison Patricia Toth, MD;  Location: [facility];  Service: Orthopedics;  Laterality: Left;   UPPER GASTROINTESTINAL ENDOSCOPY  12/12/2013   MUS   EGD  12/12/2013   MUS @ [facility] - no rpt per MUS   COLONOSCOPY  10/20/2016   Redundant colon/ Entire examined colon is normal/FHx colon cancer-Sister/Repeat 5yrs/MUS   Right total knee arthroplasty using computer-assisted navigation  09/06/2018   Dr Hooten   Left total knee arthroplasty using computer-assisted navigation  05/09/2022   Dr Hooten   Colon @ [facility]  09/29/2022   Tubular adenoma/SSP/FHx CC/Repeat 3yrs/SMR   CATARACT EXTRACTION     EYELID SURGERY Bilateral    Blepharoplasty   EYELID SURGERY Right    Basal cell carcinoma   GLAUCOMA EYE SURGERY Bilateral    SLT   LENS EYE SURGERY Bilateral    CEIOL   VITREOUS RETINAL SURGERY Right    PRP      FAMILY HISTORY: Family History  Problem Relation Age of Onset   Heart disease Mother    Diabetes type II Mother    High blood pressure (Hypertension) Mother    Stroke Mother    Diabetes type II Father    Heart disease Father    Myocardial Infarction (Heart attack) Father    High blood pressure (Hypertension) Father    Colon cancer Sister    Diabetes type II Sister    Dementia Sister    No Known Problems Sister    Diabetes type II Brother    Colon cancer Brother    Brain cancer Brother    Brain cancer Brother    Cancer Brother        Unknown type   Heart disease Daughter    Heart disease Maternal Grandfather    High blood pressure (Hypertension) Maternal Grandfather    Diabetes type II Maternal Grandfather    Anesthesia problems Neg Hx    Malignant hyperthermia Neg Hx    Hypothyroidism Neg Hx    Glaucoma Neg Hx    Macular degeneration Neg Hx    Breast cancer Neg Hx      SOCIAL HISTORY: Social History   Socioeconomic History   Marital status: Married    Spouse name: Jimmy   Number of children: 1   Years of education: GED   Highest education level: GED or equivalent  Occupational History   Occupation: Retired  Tobacco Use   Smoking status: Never   Smokeless tobacco: Never  Vaping Use   Vaping Use: Never used  Substance and Sexual Activity   Alcohol use: Yes    Alcohol/week: 2.0 standard drinks of alcohol    Types: 2 Glasses of wine per week    Comment: Daily   Drug use: No   Sexual activity: Not Currently    Partners: Male    Birth control/protection: Post-menopausal    PHYSICAL EXAM: Vitals:   11/11/22 1021  BP: 136/71  Pulse: 68   Body mass index is 34.39 kg/m. Weight: 82.6 kg (182 lb)   GENERAL: Alert, active, oriented x3  HEENT: Pupils equal reactive to light. Extraocular movements are intact. Sclera clear. Palpebral conjunctiva normal red color.Pharynx clear.  NECK: Supple with no palpable mass and no adenopathy.  LUNGS: Sound clear  with no rales rhonchi or wheezes.  HEART: Regular rhythm S1 and S2 without murmur.  ABDOMEN: Soft and depressible, nontender with no palpable mass, no hepatomegaly.  Umbilical hernia.  Partial incarcerated.  Unable to completely reduce.  EXTREMITIES:   Well-developed well-nourished symmetrical with no dependent edema.  NEUROLOGICAL: Awake alert oriented, facial expression symmetrical, moving all extremities.  REVIEW OF DATA: I have reviewed the following data today: Initial consult on 11/11/2022  Component Date Value   WBC (White Blood Cell Co* 11/11/2022 8.0    RBC (Red Blood Cell Coun* 11/11/2022 4.43    Hemoglobin 11/11/2022 13.9    Hematocrit 11/11/2022 41.9    MCV (Mean Corpuscular Vo* 11/11/2022 94.6    MCH (Mean Corpuscular He* 11/11/2022 31.4 (H)    MCHC (Mean Corpuscular H* 11/11/2022 33.2    Platelet Count 11/11/2022 339    RDW-CV (Red Cell Distrib* 11/11/2022 14.7    MPV (Mean Platelet Volum* 11/11/2022 8.9 (L)    Neutrophils 11/11/2022 5.30    Lymphocytes 11/11/2022 1.81    Monocytes 11/11/2022 0.59    Eosinophils 11/11/2022 0.22    Basophils 11/11/2022 0.04    Neutrophil % 11/11/2022 66.2    Lymphocyte % 11/11/2022 22.6    Monocyte % 11/11/2022 7.4    Eosinophil % 11/11/2022 2.8    Basophil% 11/11/2022 0.5    Immature Granulocyte % 11/11/2022 0.5    Immature Granulocyte Cou* 11/11/2022 0.04    Glucose 11/11/2022 154 (H)    Sodium 11/11/2022 133 (L)    Potassium 11/11/2022 5.3 (H)    Chloride 11/11/2022 100    Carbon Dioxide (CO2) 11/11/2022 28.7    Urea Nitrogen (BUN) 11/11/2022 14    Creatinine 11/11/2022 0.6    Glomerular Filtration Ra* 11/11/2022 97    Calcium 11/11/2022 9.3    AST  11/11/2022 15    ALT  11/11/2022 13    Alk Phos (alkaline Phosp* 11/11/2022 56    Albumin 11/11/2022 4.0    Bilirubin, Total 11/11/2022 0.4    Protein, Total 11/11/2022 7.1    A/G Ratio 11/11/2022 1.3      ASSESSMENT: Ms. Buttrey is a 71 y.o. female presenting for  consultation for umbilical hernia.    The patient presents with a symptomatic umbilical hernia. Patient was oriented about the diagnosis of umbilical hernia and its implication. The patient was oriented about the treatment alternatives (observation vs surgical repair). Due to patient symptoms, repair is recommended. Patient oriented about the surgical procedure, the use of mesh and its risk of complications such as: infection, bleeding, injury to vasculature, injury to bowel or bladder, and chronic pain, intestinal obstruction, among others.   Umbilical hernia, incarcerated [K42.0]  PLAN: Robotic assisted laparoscopic umbilical hernia repair with mesh (49594) CBC, CMP Hold aspirin 5 days before surgery Contact us if you have any concern.   Patient verbalized understanding, all questions were answered, and were agreeable with the plan outlined above.    Siona Coulston Cintron-Diaz, MD  Electronically signed by Areen Trautner Cintron-Diaz, MD  

## 2022-11-24 ENCOUNTER — Other Ambulatory Visit: Payer: Self-pay

## 2022-11-24 ENCOUNTER — Encounter
Admission: RE | Admit: 2022-11-24 | Discharge: 2022-11-24 | Disposition: A | Discharge status: Home / Self Care | Payer: Medicare Other | Source: Ambulatory Visit | Attending: General Surgery | Admitting: General Surgery

## 2022-11-24 VITALS — Ht 61.0 in | Wt 182.0 lb

## 2022-11-24 DIAGNOSIS — E103513 Type 1 diabetes mellitus with proliferative diabetic retinopathy with macular edema, bilateral: Secondary | ICD-10-CM

## 2022-11-24 NOTE — Patient Instructions (Addendum)
Your procedure is scheduled on: Monday December 01, 2022.  Report to the Registration Desk on the 1st floor of the [facility]. To find out your arrival time, please call [phone] between 1PM - 3PM on: Friday November 28, 2022. If your arrival time is 6:00 am, do not arrive before that time as the [facility] entrance doors do not open until 6:00 am.  REMEMBER: Instructions that are not followed completely may result in serious medical risk, up to and including death; or upon the discretion of your surgeon and anesthesiologist your surgery may need to be rescheduled.  Do not eat food or drink fluids after midnight the night before surgery.  No gum chewing or hard candies.   One week prior to surgery: Stop Anti-inflammatories (NSAIDS) such as Advil, Aleve, Ibuprofen, Motrin, Naproxen, Naprosyn and Aspirin based products such as Excedrin, Goody's Powder, BC Powder. Stop ANY OVER THE COUNTER supplements until after surgery. You may however, continue to take Tylenol if needed for pain up until the day of surgery.  Continue taking all prescribed medications with the exception of the following:  Follow recommendations from Endocrinologist for your insulin pump as he said to keep in Exercise Mode.   Follow recommendations from Cardiologist or PCP regarding stopping blood thinners.  TAKE ONLY THESE MEDICATIONS THE MORNING OF SURGERY WITH A SIP OF WATER:  amLODipine (NORVASC) 10 MG  cetirizine (ZYRTEC) 10 MG  omeprazole (PRILOSEC) 40 MG Antacid (take one the night before and one on the morning of surgery - helps to prevent nausea after surgery.)  Use inhalers on the day of surgery and bring to the hospital. Azelastine-Fluticasone 137-50 MCG/ACT SUSP   No Alcohol for 24 hours before or after surgery.  No Smoking including e-cigarettes for 24 hours before surgery.  No chewable tobacco products for at least 6 hours before surgery.  No nicotine patches on the day of surgery.  Do not use any  "recreational" drugs for at least a week (preferably 2 weeks) before your surgery.  Please be advised that the combination of cocaine and anesthesia may have negative outcomes, up to and including death. If you test positive for cocaine, your surgery will be cancelled.  On the morning of surgery brush your teeth with toothpaste and water, you may rinse your mouth with mouthwash if you wish. Do not swallow any toothpaste or mouthwash.  Use CHG Soap or wipes as directed on instruction sheet.  Do not wear jewelry, make-up, hairpins, clips or nail polish.  Do not wear lotions, powders, or perfumes.   Do not shave body hair from the neck down 48 hours before surgery.  Contact lenses, hearing aids and dentures may not be worn into surgery.  Do not bring valuables to the hospital. [facility] is not responsible for any missing/lost belongings or valuables.   Total Shoulder Arthroplasty:  use Benzoyl Peroxide 5% Gel as directed on instruction sheet.  Bring your C-PAP to the hospital in case you may have to spend the night.   Notify your doctor if there is any change in your medical condition (cold, fever, infection).  Wear comfortable clothing (specific to your surgery type) to the hospital.  After surgery, you can help prevent lung complications by doing breathing exercises.  Take deep breaths and cough every 1-2 hours. Your doctor may order a device called an Incentive Spirometer to help you take deep breaths. When coughing or sneezing, hold a pillow firmly against your incision with both hands. This is called "  splinting." Doing this helps protect your incision. It also decreases belly discomfort.  If you are being admitted to the hospital overnight, leave your suitcase in the car. After surgery it may be brought to your room.  In case of increased patient census, it may be necessary for you, the patient, to continue your postoperative care in the Same Day Surgery department.  If you are  being discharged the day of surgery, you will not be allowed to drive home. You will need a responsible individual to drive you home and stay with you for 24 hours after surgery.   If you are taking public transportation, you will need to have a responsible individual with you.  Please call the [facility] Dept. at [phone] if you have any questions about these instructions.  Surgery Visitation Policy:  Patients having surgery or a procedure may have two visitors.  Children under the age of 59 must have an adult with them who is not the patient.  Inpatient Visitation:    Visiting hours are 7 a.m. to 8 p.m. Up to four visitors are allowed at one time in a patient room. The visitors may rotate out with other people during the day.  One visitor age 76 or older may stay with the patient overnight and must be in the room by 8 p.m.    Preparing for Surgery with CHLORHEXIDINE GLUCONATE (CHG) Soap  Chlorhexidine Gluconate (CHG) Soap  o An antiseptic cleaner that kills germs and bonds with the skin to continue killing germs even after washing  o Used for showering the night before surgery and morning of surgery  Before surgery, you can play an important role by reducing the number of germs on your skin.  CHG (Chlorhexidine gluconate) soap is an antiseptic cleanser which kills germs and bonds with the skin to continue killing germs even after washing.  Please do not use if you have an allergy to CHG or antibacterial soaps. If your skin becomes reddened/irritated stop using the CHG.  1. Shower the NIGHT BEFORE SURGERY and the MORNING OF SURGERY with CHG soap.  2. If you choose to wash your hair, wash your hair first as usual with your normal shampoo.  3. After shampooing, rinse your hair and body thoroughly to remove the shampoo.  4. Use CHG as you would any other liquid soap. You can apply CHG directly to the skin and wash gently with a scrungie or a clean  washcloth.  5. Apply the CHG soap to your body only from the neck down. Do not use on open wounds or open sores. Avoid contact with your eyes, ears, mouth, and genitals (private parts). Wash face and genitals (private parts) with your normal soap.  6. Wash thoroughly, paying special attention to the area where your surgery will be performed.  7. Thoroughly rinse your body with warm water.  8. Do not shower/wash with your normal soap after using and rinsing off the CHG soap.  9. Pat yourself dry with a clean towel.  10. Wear clean pajamas to bed the night before surgery.  12. Place clean sheets on your bed the night of your first shower and do not sleep with pets.  13. Shower again with the CHG soap on the day of surgery prior to arriving at the hospital.  14. Do not apply any deodorants/lotions/powders.  15. Please wear clean clothes to the hospital.

## 2022-12-01 ENCOUNTER — Ambulatory Visit
Admission: RE | Admit: 2022-12-01 | Discharge: 2022-12-01 | Disposition: A | Discharge status: Home / Self Care | Payer: Medicare Other | Attending: General Surgery | Admitting: General Surgery

## 2022-12-01 ENCOUNTER — Ambulatory Visit: Payer: Medicare Other | Admitting: Anesthesiology

## 2022-12-01 ENCOUNTER — Other Ambulatory Visit: Payer: Self-pay

## 2022-12-01 ENCOUNTER — Encounter: Payer: Self-pay | Admitting: General Surgery

## 2022-12-01 ENCOUNTER — Encounter
Admission: RE | Disposition: A | Discharge status: Home / Self Care | Payer: Self-pay | Source: Home / Self Care | Attending: General Surgery

## 2022-12-01 DIAGNOSIS — K219 Gastro-esophageal reflux disease without esophagitis: Secondary | ICD-10-CM | POA: Diagnosis not present

## 2022-12-01 DIAGNOSIS — E785 Hyperlipidemia, unspecified: Secondary | ICD-10-CM | POA: Insufficient documentation

## 2022-12-01 DIAGNOSIS — E103593 Type 1 diabetes mellitus with proliferative diabetic retinopathy without macular edema, bilateral: Secondary | ICD-10-CM | POA: Diagnosis not present

## 2022-12-01 DIAGNOSIS — I10 Essential (primary) hypertension: Secondary | ICD-10-CM | POA: Diagnosis not present

## 2022-12-01 DIAGNOSIS — Z96651 Presence of right artificial knee joint: Secondary | ICD-10-CM | POA: Insufficient documentation

## 2022-12-01 DIAGNOSIS — K760 Fatty (change of) liver, not elsewhere classified: Secondary | ICD-10-CM | POA: Diagnosis not present

## 2022-12-01 DIAGNOSIS — H35033 Hypertensive retinopathy, bilateral: Secondary | ICD-10-CM | POA: Insufficient documentation

## 2022-12-01 DIAGNOSIS — K420 Umbilical hernia with obstruction, without gangrene: Secondary | ICD-10-CM | POA: Diagnosis present

## 2022-12-01 DIAGNOSIS — Z794 Long term (current) use of insulin: Secondary | ICD-10-CM | POA: Diagnosis not present

## 2022-12-01 DIAGNOSIS — Z6834 Body mass index (BMI) 34.0-34.9, adult: Secondary | ICD-10-CM | POA: Diagnosis not present

## 2022-12-01 DIAGNOSIS — Z8249 Family history of ischemic heart disease and other diseases of the circulatory system: Secondary | ICD-10-CM | POA: Insufficient documentation

## 2022-12-01 DIAGNOSIS — E103513 Type 1 diabetes mellitus with proliferative diabetic retinopathy with macular edema, bilateral: Secondary | ICD-10-CM

## 2022-12-01 DIAGNOSIS — Z833 Family history of diabetes mellitus: Secondary | ICD-10-CM | POA: Insufficient documentation

## 2022-12-01 DIAGNOSIS — E669 Obesity, unspecified: Secondary | ICD-10-CM | POA: Insufficient documentation

## 2022-12-01 DIAGNOSIS — Z9641 Presence of insulin pump (external) (internal): Secondary | ICD-10-CM | POA: Diagnosis not present

## 2022-12-01 DIAGNOSIS — Z800 Family history of malignant neoplasm of digestive organs: Secondary | ICD-10-CM | POA: Insufficient documentation

## 2022-12-01 HISTORY — PX: INSERTION OF MESH: SHX5868

## 2022-12-01 LAB — GLUCOSE, CAPILLARY
Glucose-Capillary: 91 mg/dL (ref 70–99)
Glucose-Capillary: 173 mg/dL — ABNORMAL HIGH (ref 70–99)

## 2022-12-01 SURGERY — REPAIR, HERNIA, UMBILICAL, ROBOT-ASSISTED
Anesthesia: General | Site: Abdomen

## 2022-12-01 MED ORDER — OXYCODONE HCL 5 MG/5ML PO SOLN: 5.0000 mg | Freq: Once | ORAL | Status: AC | PRN

## 2022-12-01 MED ORDER — EPHEDRINE SULFATE (PRESSORS) 50 MG/ML IJ SOLN
INTRAMUSCULAR | Status: DC | PRN
  Administered 2022-12-01: 10 mg via INTRAVENOUS
  Administered 2022-12-01: 5 mg via INTRAVENOUS

## 2022-12-01 MED ORDER — PROPOFOL 10 MG/ML IV BOLUS
INTRAVENOUS | Status: DC | PRN
  Administered 2022-12-01: 40 mg via INTRAVENOUS
  Administered 2022-12-01: 160 mg via INTRAVENOUS

## 2022-12-01 MED ORDER — SUCCINYLCHOLINE CHLORIDE 200 MG/10ML IV SOSY
PREFILLED_SYRINGE | INTRAVENOUS | Status: AC
  Filled 2022-12-01: qty 10

## 2022-12-01 MED ORDER — SUGAMMADEX SODIUM 500 MG/5ML IV SOLN
INTRAVENOUS | Status: DC | PRN
  Administered 2022-12-01: 200 mg via INTRAVENOUS

## 2022-12-01 MED ORDER — FENTANYL CITRATE (PF) 100 MCG/2ML IJ SOLN: 25.0000 ug | INTRAMUSCULAR | Status: DC | PRN

## 2022-12-01 MED ORDER — ACETAMINOPHEN 10 MG/ML IV SOLN: 1000.0000 mg | Freq: Once | INTRAVENOUS | Status: DC | PRN

## 2022-12-01 MED ORDER — PHENYLEPHRINE 80 MCG/ML (10ML) SYRINGE FOR IV PUSH (FOR BLOOD PRESSURE SUPPORT)
PREFILLED_SYRINGE | INTRAVENOUS | Status: AC
  Filled 2022-12-01: qty 10

## 2022-12-01 MED ORDER — MIDAZOLAM HCL 2 MG/2ML IJ SOLN
INTRAMUSCULAR | Status: AC
  Filled 2022-12-01: qty 2

## 2022-12-01 MED ORDER — PROMETHAZINE HCL 25 MG/ML IJ SOLN: 6.2500 mg | INTRAMUSCULAR | Status: DC | PRN

## 2022-12-01 MED ORDER — ONDANSETRON HCL 4 MG/2ML IJ SOLN
INTRAMUSCULAR | Status: DC | PRN
  Administered 2022-12-01: 4 mg via INTRAVENOUS

## 2022-12-01 MED ORDER — FENTANYL CITRATE (PF) 100 MCG/2ML IJ SOLN
INTRAMUSCULAR | Status: DC | PRN
  Administered 2022-12-01 (×2): 50 ug via INTRAVENOUS

## 2022-12-01 MED ORDER — FENTANYL CITRATE (PF) 100 MCG/2ML IJ SOLN
INTRAMUSCULAR | Status: AC
  Filled 2022-12-01: qty 2

## 2022-12-01 MED ORDER — PHENYLEPHRINE 80 MCG/ML (10ML) SYRINGE FOR IV PUSH (FOR BLOOD PRESSURE SUPPORT)
PREFILLED_SYRINGE | INTRAVENOUS | Status: DC | PRN
  Administered 2022-12-01: 80 ug via INTRAVENOUS

## 2022-12-01 MED ORDER — ORAL CARE MOUTH RINSE: 15.0000 mL | Freq: Once | OROMUCOSAL | Status: AC

## 2022-12-01 MED ORDER — CEFAZOLIN SODIUM-DEXTROSE 2-4 GM/100ML-% IV SOLN
2.0000 g | INTRAVENOUS | Status: AC
  Administered 2022-12-01: 2 g via INTRAVENOUS

## 2022-12-01 MED ORDER — SODIUM CHLORIDE 0.9 % IV SOLN: INTRAVENOUS | Status: DC

## 2022-12-01 MED ORDER — ROCURONIUM BROMIDE 10 MG/ML (PF) SYRINGE
PREFILLED_SYRINGE | INTRAVENOUS | Status: AC
  Filled 2022-12-01: qty 10

## 2022-12-01 MED ORDER — DEXAMETHASONE SODIUM PHOSPHATE 10 MG/ML IJ SOLN
INTRAMUSCULAR | Status: AC
  Filled 2022-12-01: qty 1

## 2022-12-01 MED ORDER — DROPERIDOL 2.5 MG/ML IJ SOLN: 0.6250 mg | Freq: Once | INTRAMUSCULAR | Status: DC | PRN

## 2022-12-01 MED ORDER — PROPOFOL 10 MG/ML IV BOLUS
INTRAVENOUS | Status: AC
  Filled 2022-12-01: qty 20

## 2022-12-01 MED ORDER — DEXMEDETOMIDINE HCL IN NACL 80 MCG/20ML IV SOLN
INTRAVENOUS | Status: AC
  Filled 2022-12-01: qty 20

## 2022-12-01 MED ORDER — HYDROCODONE-ACETAMINOPHEN 5-325 MG PO TABS: 1.0000 | ORAL_TABLET | ORAL | 0 refills | Status: AC | PRN

## 2022-12-01 MED ORDER — BUPIVACAINE HCL (PF) 0.25 % IJ SOLN
INTRAMUSCULAR | Status: AC
  Filled 2022-12-01: qty 30

## 2022-12-01 MED ORDER — ONDANSETRON HCL 4 MG/2ML IJ SOLN
INTRAMUSCULAR | Status: AC
  Filled 2022-12-01: qty 2

## 2022-12-01 MED ORDER — ACETAMINOPHEN 10 MG/ML IV SOLN
INTRAVENOUS | Status: AC
  Filled 2022-12-01: qty 100

## 2022-12-01 MED ORDER — DEXMEDETOMIDINE HCL IN NACL 80 MCG/20ML IV SOLN
INTRAVENOUS | Status: DC | PRN
  Administered 2022-12-01: 8 ug via BUCCAL

## 2022-12-01 MED ORDER — OXYCODONE HCL 5 MG PO TABS
ORAL_TABLET | ORAL | Status: AC
  Filled 2022-12-01: qty 1

## 2022-12-01 MED ORDER — CHLORHEXIDINE GLUCONATE 0.12 % MT SOLN
15.0000 mL | Freq: Once | OROMUCOSAL | Status: AC
  Administered 2022-12-01: 15 mL via OROMUCOSAL

## 2022-12-01 MED ORDER — EPHEDRINE 5 MG/ML INJ
INTRAVENOUS | Status: AC
  Filled 2022-12-01: qty 5

## 2022-12-01 MED ORDER — BUPIVACAINE HCL 0.25 % IJ SOLN
INTRAMUSCULAR | Status: DC | PRN
  Administered 2022-12-01: 30 mL

## 2022-12-01 MED ORDER — ROCURONIUM BROMIDE 100 MG/10ML IV SOLN
INTRAVENOUS | Status: DC | PRN
  Administered 2022-12-01 (×2): 10 mg via INTRAVENOUS
  Administered 2022-12-01: 40 mg via INTRAVENOUS

## 2022-12-01 MED ORDER — OXYCODONE HCL 5 MG PO TABS
5.0000 mg | ORAL_TABLET | Freq: Once | ORAL | Status: AC | PRN
  Administered 2022-12-01: 5 mg via ORAL

## 2022-12-01 MED ORDER — LIDOCAINE HCL (CARDIAC) PF 100 MG/5ML IV SOSY
PREFILLED_SYRINGE | INTRAVENOUS | Status: DC | PRN
  Administered 2022-12-01: 60 mg via INTRAVENOUS

## 2022-12-01 MED ORDER — DEXAMETHASONE SODIUM PHOSPHATE 10 MG/ML IJ SOLN
INTRAMUSCULAR | Status: DC | PRN
  Administered 2022-12-01: 5 mg via INTRAVENOUS

## 2022-12-01 MED ORDER — CHLORHEXIDINE GLUCONATE 0.12 % MT SOLN
OROMUCOSAL | Status: AC
  Filled 2022-12-01: qty 15

## 2022-12-01 MED ORDER — SUCCINYLCHOLINE CHLORIDE 200 MG/10ML IV SOSY
PREFILLED_SYRINGE | INTRAVENOUS | Status: DC | PRN
  Administered 2022-12-01: 100 mg via INTRAVENOUS

## 2022-12-01 MED ORDER — CEFAZOLIN SODIUM-DEXTROSE 2-4 GM/100ML-% IV SOLN
INTRAVENOUS | Status: AC
  Filled 2022-12-01: qty 100

## 2022-12-01 MED ORDER — LIDOCAINE HCL (PF) 2 % IJ SOLN
INTRAMUSCULAR | Status: AC
  Filled 2022-12-01: qty 5

## 2022-12-01 MED ORDER — MIDAZOLAM HCL 2 MG/2ML IJ SOLN
INTRAMUSCULAR | Status: DC | PRN
  Administered 2022-12-01: 1 mg via INTRAVENOUS

## 2022-12-01 MED ORDER — ACETAMINOPHEN 10 MG/ML IV SOLN
INTRAVENOUS | Status: DC | PRN
  Administered 2022-12-01: 1000 mg via INTRAVENOUS

## 2022-12-01 SURGICAL SUPPLY — 55 items
ADH SKN CLS APL DERMABOND .7 (GAUZE/BANDAGES/DRESSINGS) ×2
BAG PRESSURE INF REUSE 1000 (BAG) IMPLANT
BLADE SURG SZ11 CARB STEEL (BLADE) ×2 IMPLANT
COVER TIP SHEARS 8 DVNC (MISCELLANEOUS) ×2 IMPLANT
COVER TIP SHEARS 8MM DA VINCI (MISCELLANEOUS) ×2
COVER WAND RF STERILE (DRAPES) ×2 IMPLANT
DERMABOND ADVANCED .7 DNX12 (GAUZE/BANDAGES/DRESSINGS) ×2 IMPLANT
DRAPE ARM DVNC X/XI (DISPOSABLE) ×6 IMPLANT
DRAPE COLUMN DVNC XI (DISPOSABLE) ×2 IMPLANT
DRAPE DA VINCI XI ARM (DISPOSABLE) ×6
DRAPE DA VINCI XI COLUMN (DISPOSABLE) ×2
ELECT REM PT RETURN 9FT ADLT (ELECTROSURGICAL) ×2
ELECTRODE REM PT RTRN 9FT ADLT (ELECTROSURGICAL) ×2 IMPLANT
FORCEPS BPLR R/ABLATION 8 DVNC (INSTRUMENTS) ×2 IMPLANT
GLOVE BIO SURGEON STRL SZ 6.5 (GLOVE) ×4 IMPLANT
GLOVE BIOGEL PI IND STRL 6.5 (GLOVE) ×4 IMPLANT
GOWN STRL REUS W/ TWL LRG LVL3 (GOWN DISPOSABLE) ×6 IMPLANT
GOWN STRL REUS W/TWL LRG LVL3 (GOWN DISPOSABLE) ×6
IRRIGATOR SUCT 8 DISP DVNC XI (IRRIGATION / IRRIGATOR) IMPLANT
IRRIGATOR SUCTION 8MM XI DISP (IRRIGATION / IRRIGATOR)
IV CATH ANGIO 12GX3 LT BLUE (NEEDLE) IMPLANT
IV NS 1000ML (IV SOLUTION)
IV NS 1000ML BAXH (IV SOLUTION) IMPLANT
KIT PINK PAD W/HEAD ARE REST (MISCELLANEOUS) ×2
KIT PINK PAD W/HEAD ARM REST (MISCELLANEOUS) ×2 IMPLANT
LABEL OR SOLS (LABEL) ×2 IMPLANT
MANIFOLD NEPTUNE II (INSTRUMENTS) ×2 IMPLANT
MESH VENTRALIGHT ST 4.5IN (Mesh General) IMPLANT
NDL DRIVE SUT CUT DVNC (INSTRUMENTS) ×2 IMPLANT
NDL HYPO 22X1.5 SAFETY MO (MISCELLANEOUS) ×2 IMPLANT
NDL INSUFFLATION 14GA 120MM (NEEDLE) ×2 IMPLANT
NEEDLE DRIVE SUT CUT DVNC (INSTRUMENTS) ×2 IMPLANT
NEEDLE HYPO 22X1.5 SAFETY MO (MISCELLANEOUS) ×2 IMPLANT
NEEDLE INSUFFLATION 14GA 120MM (NEEDLE) ×2 IMPLANT
NS IRRIG 500ML POUR BTL (IV SOLUTION) ×2 IMPLANT
OBTURATOR OPTICAL STANDARD 8MM (TROCAR) ×2
OBTURATOR OPTICAL STND 8 DVNC (TROCAR) ×2
OBTURATOR OPTICALSTD 8 DVNC (TROCAR) ×2 IMPLANT
PACK LAP CHOLECYSTECTOMY (MISCELLANEOUS) ×2 IMPLANT
SCISSORS MNPLR CVD DVNC XI (INSTRUMENTS) ×2 IMPLANT
SCISSORS XI MNPLR CVD DVNC (INSTRUMENTS) ×2
SEAL UNIV 5-12 XI (MISCELLANEOUS) ×6 IMPLANT
SEAL XI UNIVERSAL 5-12 (MISCELLANEOUS) ×6
SET TUBE SMOKE EVAC HIGH FLOW (TUBING) ×2 IMPLANT
SOL ELECTROSURG ANTI STICK (MISCELLANEOUS) ×2
SOLUTION ELECTROSURG ANTI STCK (MISCELLANEOUS) ×2 IMPLANT
SUT MNCRL 4-0 (SUTURE) ×4
SUT MNCRL 4-0 27XMFL (SUTURE) ×4
SUT STRATAFIX PDS 30 CT-1 (SUTURE) ×2 IMPLANT
SUT VICRYL 0 UR6 27IN ABS (SUTURE) ×2 IMPLANT
SUT VLOC 90 2/L VL 12 GS22 (SUTURE) ×4 IMPLANT
SUTURE MNCRL 4-0 27XMF (SUTURE) ×2 IMPLANT
TAPE TRANSPORE STRL 2 31045 (GAUZE/BANDAGES/DRESSINGS) ×2 IMPLANT
TRAP FLUID SMOKE EVACUATOR (MISCELLANEOUS) ×2 IMPLANT
WATER STERILE IRR 500ML POUR (IV SOLUTION) ×2 IMPLANT

## 2022-12-01 NOTE — Anesthesia Postprocedure Evaluation (Signed)
Anesthesia Post Note  Patient: Allen Cohenour  Procedure(s) Performed: XI ROBOT ASSISTED UMBILICAL HERNIA REPAIR (Abdomen) INSERTION OF MESH  Patient location during evaluation: PACU Anesthesia Type: General Level of consciousness: awake and alert Pain management: pain level controlled Vital Signs Assessment: post-procedure vital signs reviewed and stable Respiratory status: spontaneous breathing, nonlabored ventilation, respiratory function stable and patient connected to nasal cannula oxygen Cardiovascular status: blood pressure returned to baseline and stable Postop Assessment: no apparent nausea or vomiting Anesthetic complications: no   No notable events documented.   Last Vitals:  Vitals:   12/01/22 0945 12/01/22 0951  BP: 136/66 (!) 155/66  Pulse: 69 75  Resp: 14 18  Temp: (!) 36.2 C (!) 36.3 C  SpO2: 95% 93%    Last Pain:  Vitals:   12/01/22 0951  TempSrc: Temporal  PainSc: 8                  Yevette Edwards

## 2022-12-01 NOTE — Anesthesia Preprocedure Evaluation (Addendum)
Anesthesia Evaluation  Patient identified by MRN, date of birth, ID band Patient awake    Reviewed: Allergy & Precautions, H&P , NPO status , Patient's Chart, lab work & pertinent test results, reviewed documented beta blocker date and time   Airway Mallampati: III   Neck ROM: full    Dental  (+) Edentulous Lower, Partial Upper   Pulmonary neg shortness of breath   Pulmonary exam normal        Cardiovascular Exercise Tolerance: Good hypertension, On Medications Normal cardiovascular exam Rhythm:regular Rate:Normal     Neuro/Psych Balance Issues Proliferative Retinopathy  negative psych ROS   GI/Hepatic Neg liver ROS,GERD  Medicated,,  Endo/Other  diabetes, Well Controlled, Type 1    Renal/GU negative Renal ROS  negative genitourinary   Musculoskeletal   Abdominal  (+) + obese  Peds  Hematology negative hematology ROS (+)   Anesthesia Other Findings Past Medical History: No date: Arthritis     Comment:  OSTEOARTHRITIS No date: Asthma due to seasonal allergies     Comment:  SEVERE sinus drainage. difficulty breathing if laying               flat No date: Baker's cyst of knee No date: Cancer [facility])     Comment:  basal cell No date: Dental root implant present No date: Diabetes mellitus without complication (HCC)     Comment:  TYPE I, USES INSULIN PUMP No date: Diabetic arthropathy (HCC) No date: Family history of adverse reaction to anesthesia     Comment:  sister n/v takes long time to wake up No date: Fatty liver No date: GERD (gastroesophageal reflux disease) 2019: Glaucoma No date: Hyperlipidemia No date: Hypertension No date: Hypertensive retinopathy 08/2018: Insulin pump in place No date: Microalbuminuria 2014: Shingles Past Surgical History: 1982: BREAST BIOPSY; Left     Comment:  neg No date: COLONOSCOPY 10/20/2016: COLONOSCOPY WITH PROPOFOL; N/A     Comment:  Procedure: COLONOSCOPY WITH  PROPOFOL;  Surgeon: Christena Deem, MD;  Location: [facility] ENDOSCOPY;  Service:               Endoscopy;  Laterality: N/A; No date: ESOPHAGOGASTRODUODENOSCOPY No date: EYE SURGERY; Bilateral     Comment:  trabeculoplasty 06/2018 No date: JOINT REPLACEMENT 09/06/2018: KNEE ARTHROPLASTY; Right     Comment:  Procedure: COMPUTER ASSISTED TOTAL KNEE ARTHROPLASTY;                Surgeon: Donato Heinz, MD;  Location: [facility] ORS;                Service: Orthopedics;  Laterality: Right; 05/09/2022: KNEE ARTHROPLASTY; Left     Comment:  Procedure: COMPUTER ASSISTED TOTAL KNEE ARTHROPLASTY;                Surgeon: Donato Heinz, MD;  Location: [facility] ORS;                Service: Orthopedics;  Laterality: Left; 07/2018: PANRETINAL PHOTOCOAGULATION; Left 2005: POPLITEAL SYNOVIAL CYST EXCISION; Left No date: REFRACTIVE SURGERY; Bilateral     Comment:  blood vessels draining/bleeding . pressures still               elevated No date: TOTAL KNEE ARTHROPLASTY; Right BMI    Body Mass Index: 32.85 kg/m     Reproductive/Obstetrics negative OB ROS  Anesthesia Physical Anesthesia Plan  ASA: 3  Anesthesia Plan: General   Post-op Pain Management: Ofirmev IV (intra-op)* and Toradol IV (intra-op)*   Induction: Intravenous  PONV Risk Score and Plan: 4 or greater and Ondansetron, Dexamethasone, Propofol infusion, Treatment may vary due to age or medical condition and Droperidol  Airway Management Planned: Oral ETT  Additional Equipment:   Intra-op Plan:   Post-operative Plan: Extubation in OR  Informed Consent: I have reviewed the patients History and Physical, chart, labs and discussed the procedure including the risks, benefits and alternatives for the proposed anesthesia with the patient or authorized representative who has indicated his/her understanding and acceptance.     Dental Advisory Given  Plan Discussed with:  CRNA  Anesthesia Plan Comments: (Pt was seen by endocrinology pre-op with instructions to decrease basal rate of insulin pump. We will monitor the blood glucose intra-op and adjust settings as needed. )       Anesthesia Quick Evaluation

## 2022-12-01 NOTE — Transfer of Care (Signed)
Immediate Anesthesia Transfer of Care Note  Patient: Cindy Sandoval  Procedure(s) Performed: XI ROBOT ASSISTED UMBILICAL HERNIA REPAIR (Abdomen) INSERTION OF MESH  Patient Location: PACU  Anesthesia Type:General  Level of Consciousness: awake, oriented, and patient cooperative  Airway & Oxygen Therapy: Patient Spontanous Breathing and Patient connected to face mask oxygen  Post-op Assessment: Report given to RN and Post -op Vital signs reviewed and stable  Post vital signs: Reviewed and stable  Last Vitals:  Vitals Value Taken Time  BP 134/57 12/01/22 0911  Temp    Pulse 69 12/01/22 0914  Resp 16 12/01/22 0914  SpO2 100 % 12/01/22 0914  Vitals shown include unvalidated device data.  Last Pain:  Vitals:   12/01/22 2355  TempSrc: Tympanic  PainSc: 0-No pain         Complications: No notable events documented.

## 2022-12-01 NOTE — Interval H&P Note (Signed)
History and Physical Interval Note:  12/01/2022 7:04 AM  Cindy Sandoval  has presented today for surgery, with the diagnosis of umbilical hernia, incarcerated K42.0.  The various methods of treatment have been discussed with the patient and family. After consideration of risks, benefits and other options for treatment, the patient has consented to  Procedure(s): XI ROBOT ASSISTED UMBILICAL HERNIA REPAIR (N/A) as a surgical intervention.  The patient's history has been reviewed, patient examined, no change in status, stable for surgery.  I have reviewed the patient's chart and labs.  Questions were answered to the patient's satisfaction.     Carolan Shiver

## 2022-12-01 NOTE — Discharge Instructions (Addendum)

## 2022-12-01 NOTE — Op Note (Signed)
Preoperative diagnosis: Umbilical hernia  Postoperative diagnosis: Incarcerated Umbilical hernia  Procedure: Robotic assisted laparoscopic incarcerated umbilical hernia repair with mesh  Anesthesia: General  Surgeon: Carolan Shiver, MD, FACS  Wound Classification: Clean  Specimen: none  Complications: None  Estimated Blood Loss: 5 mL  Indications: A 72 year old female with symptomatic umbilical hernia. Repair indicated to improve pain and avoid complications such as strangulation.   Findings: A 3 cm incarcerated hernia 2. Tension free repair achieved with 11.4 cm bard mesh and suture 3. Adequate hemostasis  Description of procedure: The patient was brought to the operating room and general anesthesia was induced. A time-out was completed verifying correct patient, procedure, site, positioning, and implant(s) and/or special equipment prior to beginning this procedure. Antibiotics were administered prior to making the incision. SCDs placed. The anterior abdominal wall was prepped and draped in the standard sterile fashion.   Palmer's point chosen for entry.  Veress needle placed and abdomen insufflated to 15cm without any dramatic increase in pressure.  Needle removed and optiview technique used to place 8 mm port at same point.  No injury noted during placement. 2 additional ports were placed along left lateral aspect.  Xi robot then docked into place.  Hernia contents noted and reduced with combination of blunt, sharp dissection with scissors and fenestrated forceps.  Hemostasis achieved throughout this portion.  Once all hernia contents reduced, there was noted to be a 3 cm hernia.    Insufflation dropped to 75mm and transfacial suture with 0 stratafix used to primarily close defect under minimal tension. Bard protected 11.4 cm round mesh was placed within the abdominal cavity through the port and secured to the abdominal wall centered over the defect using the 0 stratafix  previously used to primarily close defect.  The mesh was then circumferentially sutured into the anterior abdominal wall using 2-0 VLock x2.  Any bleeding noted during this portion was no longer actively bleeding by end of securing mesh and tightening the suture.    Robot was undocked.  Abdomen then desufflated while camera within abdomen to ensure no signs of new bleed prior to removing camera and rest of ports completely.  All skin incisions closed with runninrg 4-0 Monocryl in a subcuticular fashion.  All wounds then dressed with Dermabond.  Patient was then successfully awakened and transferred to PACU in stable condition.  At the end of the procedure sponge and instrument counts were correct.

## 2022-12-01 NOTE — Anesthesia Procedure Notes (Signed)
Procedure Name: Intubation Date/Time: 12/01/2022 7:37 AM  Performed by: Jeannene Patella, CRNAPre-anesthesia Checklist: Patient identified, Timeout performed, Emergency Drugs available, Suction available and Patient being monitored Patient Re-evaluated:Patient Re-evaluated prior to induction Oxygen Delivery Method: Circle system utilized Preoxygenation: Pre-oxygenation with 100% oxygen Induction Type: IV induction Ventilation: Mask ventilation with difficulty Laryngoscope Size: McGraph and 4 Grade View: Grade II Tube type: Oral Tube size: 7.0 mm Number of attempts: 1 Airway Equipment and Method: Rigid stylet and Video-laryngoscopy Placement Confirmation: ETT inserted through vocal cords under direct vision, positive ETCO2 and breath sounds checked- equal and bilateral Secured at: 20 cm Tube secured with: paper. Dental Injury: Teeth and Oropharynx as per pre-operative assessment  Comments: MP 4 noted preop

## 2022-12-02 ENCOUNTER — Encounter: Payer: Self-pay | Admitting: General Surgery

## 2023-07-07 ENCOUNTER — Other Ambulatory Visit: Payer: Self-pay | Admitting: Nurse Practitioner

## 2023-07-07 DIAGNOSIS — Z1231 Encounter for screening mammogram for malignant neoplasm of breast: Secondary | ICD-10-CM

## 2023-08-10 ENCOUNTER — Ambulatory Visit
Admission: RE | Admit: 2023-08-10 | Discharge: 2023-08-10 | Disposition: A | Discharge status: Home / Self Care | Payer: Medicare Other | Source: Ambulatory Visit | Attending: Nurse Practitioner | Admitting: Nurse Practitioner

## 2023-08-10 DIAGNOSIS — Z1231 Encounter for screening mammogram for malignant neoplasm of breast: Secondary | ICD-10-CM | POA: Diagnosis present

## 2024-07-06 ENCOUNTER — Other Ambulatory Visit: Payer: Self-pay | Admitting: Nurse Practitioner

## 2024-07-06 DIAGNOSIS — Z1231 Encounter for screening mammogram for malignant neoplasm of breast: Secondary | ICD-10-CM

## 2024-08-10 ENCOUNTER — Ambulatory Visit
Admission: RE | Admit: 2024-08-10 | Discharge: 2024-08-10 | Attending: Nurse Practitioner | Admitting: Nurse Practitioner

## 2024-08-10 DIAGNOSIS — Z1231 Encounter for screening mammogram for malignant neoplasm of breast: Secondary | ICD-10-CM | POA: Diagnosis present
